# Patient Record
Sex: Female | Born: 1970 | Race: White | Hispanic: No | Marital: Married | Smoking: Former smoker
Health system: Southern US, Community
[De-identification: ages and names within clinical notes are randomized; demographics above are authoritative.]

## PROBLEM LIST (undated history)

## (undated) DIAGNOSIS — D649 Anemia, unspecified: Secondary | ICD-10-CM

## (undated) DIAGNOSIS — K219 Gastro-esophageal reflux disease without esophagitis: Secondary | ICD-10-CM

## (undated) DIAGNOSIS — D72119 Hypereosinophilic syndrome (hes), unspecified: Secondary | ICD-10-CM

## (undated) DIAGNOSIS — I350 Nonrheumatic aortic (valve) stenosis: Secondary | ICD-10-CM

## (undated) DIAGNOSIS — K311 Adult hypertrophic pyloric stenosis: Secondary | ICD-10-CM

## (undated) DIAGNOSIS — T8859XA Other complications of anesthesia, initial encounter: Secondary | ICD-10-CM

## (undated) DIAGNOSIS — J45909 Unspecified asthma, uncomplicated: Secondary | ICD-10-CM

## (undated) DIAGNOSIS — D721 Eosinophilia: Secondary | ICD-10-CM

## (undated) DIAGNOSIS — T4145XA Adverse effect of unspecified anesthetic, initial encounter: Secondary | ICD-10-CM

## (undated) HISTORY — PX: ABDOMINAL HYSTERECTOMY: SHX81

---

## 2004-07-24 ENCOUNTER — Ambulatory Visit: Payer: Self-pay | Admitting: Unknown Physician Specialty

## 2006-08-20 ENCOUNTER — Ambulatory Visit: Payer: Self-pay | Admitting: Obstetrics and Gynecology

## 2007-09-09 ENCOUNTER — Ambulatory Visit: Payer: Self-pay | Admitting: Obstetrics and Gynecology

## 2007-12-11 ENCOUNTER — Ambulatory Visit: Payer: Self-pay | Admitting: Family Medicine

## 2010-09-11 ENCOUNTER — Ambulatory Visit: Payer: Self-pay | Admitting: Family Medicine

## 2010-10-10 ENCOUNTER — Ambulatory Visit: Payer: Self-pay | Admitting: Obstetrics and Gynecology

## 2011-09-12 ENCOUNTER — Ambulatory Visit: Payer: Self-pay | Admitting: Internal Medicine

## 2011-10-14 ENCOUNTER — Ambulatory Visit: Payer: Self-pay | Admitting: Obstetrics and Gynecology

## 2012-10-14 ENCOUNTER — Ambulatory Visit: Payer: Self-pay | Admitting: Obstetrics and Gynecology

## 2013-09-05 ENCOUNTER — Ambulatory Visit: Payer: Self-pay | Admitting: Family Medicine

## 2013-10-27 ENCOUNTER — Ambulatory Visit: Payer: Self-pay

## 2014-05-19 HISTORY — PX: AORTIC VALVE REPLACEMENT: SHX41

## 2016-07-24 ENCOUNTER — Other Ambulatory Visit: Payer: Self-pay | Admitting: Family

## 2016-07-24 DIAGNOSIS — Z1239 Encounter for other screening for malignant neoplasm of breast: Secondary | ICD-10-CM

## 2016-07-28 ENCOUNTER — Inpatient Hospital Stay: Admission: RE | Admit: 2016-07-28 | Payer: Self-pay | Source: Ambulatory Visit

## 2016-08-12 ENCOUNTER — Ambulatory Visit
Admission: RE | Admit: 2016-08-12 | Discharge: 2016-08-12 | Disposition: A | Discharge status: Home / Self Care | Payer: BLUE CROSS/BLUE SHIELD | Source: Ambulatory Visit | Attending: Family | Admitting: Family

## 2016-08-12 ENCOUNTER — Encounter (INDEPENDENT_AMBULATORY_CARE_PROVIDER_SITE_OTHER): Payer: Self-pay

## 2016-08-12 DIAGNOSIS — Z1231 Encounter for screening mammogram for malignant neoplasm of breast: Secondary | ICD-10-CM | POA: Diagnosis present

## 2016-08-12 DIAGNOSIS — Z1239 Encounter for other screening for malignant neoplasm of breast: Secondary | ICD-10-CM

## 2017-12-17 ENCOUNTER — Other Ambulatory Visit: Payer: Self-pay | Admitting: Family

## 2017-12-17 DIAGNOSIS — Z1231 Encounter for screening mammogram for malignant neoplasm of breast: Secondary | ICD-10-CM

## 2018-01-11 ENCOUNTER — Ambulatory Visit
Admission: RE | Admit: 2018-01-11 | Discharge: 2018-01-11 | Disposition: A | Discharge status: Home / Self Care | Payer: BLUE CROSS/BLUE SHIELD | Source: Ambulatory Visit | Attending: Family | Admitting: Family

## 2018-01-11 DIAGNOSIS — Z1231 Encounter for screening mammogram for malignant neoplasm of breast: Secondary | ICD-10-CM | POA: Insufficient documentation

## 2018-03-11 ENCOUNTER — Ambulatory Visit (INDEPENDENT_AMBULATORY_CARE_PROVIDER_SITE_OTHER): Payer: BLUE CROSS/BLUE SHIELD | Admitting: Obstetrics & Gynecology

## 2018-03-11 ENCOUNTER — Encounter: Payer: Self-pay | Admitting: Obstetrics & Gynecology

## 2018-03-11 ENCOUNTER — Other Ambulatory Visit: Payer: Self-pay | Admitting: Internal Medicine

## 2018-03-11 VITALS — BP 120/80 | Ht 64.0 in | Wt 183.0 lb

## 2018-03-11 DIAGNOSIS — R51 Headache: Principal | ICD-10-CM

## 2018-03-11 DIAGNOSIS — N814 Uterovaginal prolapse, unspecified: Secondary | ICD-10-CM | POA: Diagnosis not present

## 2018-03-11 DIAGNOSIS — N812 Incomplete uterovaginal prolapse: Secondary | ICD-10-CM

## 2018-03-11 DIAGNOSIS — R519 Headache, unspecified: Secondary | ICD-10-CM

## 2018-03-11 DIAGNOSIS — R1031 Right lower quadrant pain: Secondary | ICD-10-CM | POA: Diagnosis not present

## 2018-03-11 DIAGNOSIS — N8111 Cystocele, midline: Secondary | ICD-10-CM | POA: Diagnosis not present

## 2018-03-11 DIAGNOSIS — J0130 Acute sphenoidal sinusitis, unspecified: Secondary | ICD-10-CM

## 2018-03-11 NOTE — Progress Notes (Signed)
CC: Vaginal Bulge  Patient is a 47 yo G2P2 WF with 2 prior NSVD 19+ years ago, does not perform chronic lifting with her career, who presents after having hysterectomy for bicornuate uterus and bleeding 10 years ago, now with gradual decline in bladder function (incontinence with cough, sneeze, laughter, exercise) and more recent (one month) of vaginal bulge and pressure and urinary urgency. Patient complains of a cystocele.  Symptoms include: prolapse of tissue with straining, urinary incontinence: mild and discomfort: mild. Symptoms have gradually worsened.    Pt also c/o recent RLQ mild-moderate intermittent sharp shooting pain without radiation or other assoc sx's; no modifiers or other context  Pt takes Coumadin for heart condition  PMHx: She  has no past medical history on file. Also,  has a past surgical history that includes Abdominal hysterectomy., family history includes Transient ischemic attack in her mother; Uterine cancer in her mother.,  reports that she has never smoked. She has never used smokeless tobacco. She reports that she does not drink alcohol or use drugs.  She has a current medication list which includes the following prescription(s): omeprazole, warfarin, cetirizine, and linzess. Also, is allergic to eggs or egg-derived products; ibuprofen; morphine; and budesonide.  Review of Systems  Constitutional: Positive for malaise/fatigue. Negative for chills and fever.  HENT: Positive for congestion. Negative for sinus pain and sore throat.   Eyes: Negative for blurred vision and pain.  Respiratory: Negative for cough and wheezing.   Cardiovascular: Negative for chest pain and leg swelling.  Gastrointestinal: Positive for constipation. Negative for abdominal pain, diarrhea, heartburn, nausea and vomiting.  Genitourinary: Negative for dysuria, frequency, hematuria and urgency.  Musculoskeletal: Positive for joint pain. Negative for back pain, myalgias and neck pain.  Skin:  Negative for itching and rash.  Neurological: Negative for dizziness, tremors and weakness.  Endo/Heme/Allergies: Does not bruise/bleed easily.  Psychiatric/Behavioral: Negative for depression. The patient is not nervous/anxious and does not have insomnia.    Objective: BP 120/80   Ht 5\' 4"  (1.626 m)   Wt 183 lb (83 kg)   BMI 31.41 kg/m  Physical Exam  Constitutional: She is oriented to person, place, and time. She appears well-developed and well-nourished. No distress.  Genitourinary: Rectum normal and vagina normal. Pelvic exam was performed with patient supine. There is no rash or lesion on the right labia. There is no rash or lesion on the left labia. Vagina exhibits no lesion. No bleeding in the vagina. Right adnexum does not display mass and does not display tenderness. Left adnexum does not display mass and does not display tenderness.  Cervix is parous. Cervix does not exhibit motion tenderness, friability or polyp.  Genitourinary Comments: Absent Uterus Cervix prolapses down close to introitus Gr 2 Cystocele Min rectocele  HENT:  Head: Normocephalic and atraumatic. Head is without laceration.  Right Ear: Hearing normal.  Left Ear: Hearing normal.  Nose: No epistaxis.  No foreign bodies.  Mouth/Throat: Uvula is midline, oropharynx is clear and moist and mucous membranes are normal.  Eyes: Pupils are equal, round, and reactive to light.  Neck: Normal range of motion. Neck supple. No thyromegaly present.  Cardiovascular: Normal rate and regular rhythm. Exam reveals no gallop and no friction rub.  No murmur heard. Pulmonary/Chest: Effort normal and breath sounds normal. No respiratory distress. She has no wheezes. Right breast exhibits no mass, no skin change and no tenderness. Left breast exhibits no mass, no skin change and no tenderness.  Abdominal: Soft. Bowel sounds are normal.  She exhibits no distension. There is no tenderness. There is no rebound.  Musculoskeletal: Normal  range of motion.  Neurological: She is alert and oriented to person, place, and time. No cranial nerve deficit.  Skin: Skin is warm and dry.  Psychiatric: She has a normal mood and affect. Judgment normal.  Vitals reviewed.  ASSESSMENT/PLAN:    Problem List Items Addressed This Visit      Genitourinary   Cervical prolapse   Cystocele, midline     Other   RLQ abdominal pain - Primary   Relevant Orders   US PELVIS TRANSVANGINAL NON-OB (TV ONLY)  Options of pessary and surgery discussed w patient related to prolapse and cystocele.    Pros and cons, side effects discussed.    Desires surgery; I would recommend trachelectomy and anterior colporrhaphy    Will also assess for ovarian etiology of RLQ pain, as may be addressed also at time of surgery if cyst or mass found   Counsel w Dr Lilian Kapur regarding Coumadin/Lovenox transition around time of surgery  Annamarie Major, MD, [facility], [facility] 03/11/2018  10:48 AM

## 2018-03-11 NOTE — Patient Instructions (Signed)
Anterior Colporrhaphy Anterior colporrhaphy is surgery to fix a prolapse of organs in the genital tract. Prolapse means the falling down, bulging, dropping, or drooping of an organ. Organs that commonly prolapse include the rectum, bladder, vagina, and uterus. Prolapse can affect a single organ or several organs at the same time. This often worsens when women stop having their monthly periods (menopause) because estrogen loss weakens the muscles and tissues in the genital tract. In addition, prolapse happens when the organs are damaged or weakened. This commonly happens after childbirth and as a result of aging. Surgery is often done for severe prolapses. The type of colporrhaphy done depends on the type of genital prolapse. Types of genital prolapse include the following:  Cystocele. This is a prolapse of the upper (anterior) wall of the vagina. The anterior wall bulges into the vagina and brings the bladder with it.  Rectocele. This is a prolapse of the lower (posterior) wall of the vagina. The posterior vaginal wall bulges into the vagina and brings the rectum with it.  Enterocele. This is a prolapse of part of the pelvic organs called the pouch of Douglas. It also involves a portion of the small bowel. It appears as a bulge under the neck of the uterus at the top of the back wall of the vagina.  Procidentia. This is a complete prolapse of the uterus and the cervix. The prolapse can be seen and felt coming out of the vagina.  LET YOUR HEALTH CARE PROVIDER KNOW ABOUT:  Any allergies you have.  All medicines you are taking, including vitamins, herbs, eye drops, creams, and over-the-counter medicines.  Previous problems you or members of your family have had with the use of anesthetics.  Any blood disorders you have.  Previous surgeries you have had.  Medical conditions you have.  Smoking history or history of alcohol use.  Possibility of pregnancy, if this applies. RISKS AND  COMPLICATIONS Generally, anterior or posterior colporrhaphy is a safe procedure. However, as with any procedure, complications can occur. Possible complications include:  Infection.  Damage to other organs during surgery.  Bleeding after surgery.  Problems urinating.  Problems from the anesthetic.  BEFORE THE PROCEDURE  Ask your health care provider about changing or stopping your regular medicines.  Do not eat or drink anything for at least 8 hours before the surgery.  If you smoke, do not smoke for at least 2 weeks before the surgery.  Make plans to have someone drive you home after your hospital stay. Also, arrange for someone to help you with activities during recovery. PROCEDURE You may be given medicine to help you relax before the surgery (sedative). During the surgery you will be given medicine to make you sleep through the procedure (general anesthetic) or medicine to numb you from the waist down (spinal anesthetic). This medicine will be given through an intravenous (IV) access tube that is put into one of your veins. The procedure will vary depending on the type of repair:  Anterior repair. A cut (incision) is made in the midline section of the front part of the vaginal wall. A triangular-shaped piece of vaginal tissue is removed, and the stronger, healthier tissue is sewn together in order to support and suspend the bladder.  Posterior repair. An incision is made midline on the back wall of the vagina. A triangular portion of vaginal skin is removed to expose the muscle. Excess tissue is removed, and stronger, healthier muscle and ligament tissue is sewn together to support   the rectum.  Anterior and posterior repair. Both procedures are done during the same surgery.  What to expect after the procedure You will be taken to a recovery area. Your blood pressure, pulse, breathing, and temperature (vital signs) will be monitored. This is done until you are stable. Then you  will be transferred to a hospital room. After surgery, you will have a small rubber tube in place to drain your bladder (urinary catheter). This will be in place for 2 to 7 days or until your bladder is working properly on its own. The IV access tube will be removed in 1 to 3 days. You may have a gauze packing in your vagina to prevent bleeding. This will be removed 2 or 3 days after the surgery. You will likely need to stay in the hospital for 3 to 5 days. This information is not intended to replace advice given to you by your health care provider. Make sure you discuss any questions you have with your health care provider. Document Released: 07/26/2003 Document Revised: 10/11/2015 Document Reviewed: 09/24/2012 Elsevier Interactive Patient Education  2017 [employer]  

## 2018-03-19 ENCOUNTER — Encounter: Payer: Self-pay | Admitting: Obstetrics & Gynecology

## 2018-03-19 ENCOUNTER — Ambulatory Visit: Payer: BLUE CROSS/BLUE SHIELD

## 2018-03-19 ENCOUNTER — Ambulatory Visit (INDEPENDENT_AMBULATORY_CARE_PROVIDER_SITE_OTHER): Payer: BLUE CROSS/BLUE SHIELD | Admitting: Obstetrics & Gynecology

## 2018-03-19 ENCOUNTER — Ambulatory Visit (INDEPENDENT_AMBULATORY_CARE_PROVIDER_SITE_OTHER): Payer: BLUE CROSS/BLUE SHIELD

## 2018-03-19 VITALS — BP 120/80 | Ht 64.0 in | Wt 184.0 lb

## 2018-03-19 DIAGNOSIS — N814 Uterovaginal prolapse, unspecified: Secondary | ICD-10-CM | POA: Diagnosis not present

## 2018-03-19 DIAGNOSIS — R1031 Right lower quadrant pain: Secondary | ICD-10-CM

## 2018-03-19 DIAGNOSIS — N8111 Cystocele, midline: Secondary | ICD-10-CM

## 2018-03-19 DIAGNOSIS — N812 Incomplete uterovaginal prolapse: Secondary | ICD-10-CM

## 2018-03-19 NOTE — H&P (View-Only) (Signed)
PRE-OPERATIVE HISTORY AND PHYSICAL EXAM  HPI:  Wendy Bryant is a 47 y.o. [id number]  is being admitted for surgery related to pelvic relaxation.  Patient has h/o 2 prior NSVD 19+ years ago, does not perform chronic lifting with her career, who presents after having hysterectomy for bicornuate uterus and bleeding 10 years ago, now with gradual decline in bladder function (incontinence with cough, sneeze, laughter, exercise) and more recent (one month) of vaginal bulge and pressure and urinary urgency. Patient complains of a cystocele.  Symptoms include: prolapse of tissue with straining, urinary incontinence: mild and discomfort: mild. Symptoms have gradually worsened.  Ultrasound today revealed no cyst or ovarian concerns.  Also no mass at cervix.  PMHx: History reviewed. No pertinent past medical history. Past Surgical History:  Procedure Laterality Date  . ABDOMINAL HYSTERECTOMY     Family History  Problem Relation Age of Onset  . Transient ischemic attack Mother   . Uterine cancer Mother   . Breast cancer Neg Hx    Social History   Tobacco Use  . Smoking status: Never Smoker  . Smokeless tobacco: Never Used  Substance Use Topics  . Alcohol use: Never    Frequency: Never  . Drug use: Never    Current Outpatient Medications:  .  acetaminophen (TYLENOL) 500 MG tablet, Take 1,000 mg by mouth every 6 (six) hours as needed for moderate pain or headache., Disp: , Rfl:  .  ADVAIR DISKUS 250-50 MCG/DOSE AEPB, Inhale 1 puff into the lungs daily., Disp: , Rfl: 1 .  cetirizine (ZYRTEC) 10 MG tablet, Take 10 mg by mouth daily. , Disp: , Rfl:  .  fluticasone (FLONASE) 50 MCG/ACT nasal spray, Place 1 spray into both nostrils daily., Disp: , Rfl:  .  LINZESS 72 MCG capsule, Take 72 mcg by mouth daily as needed (for constipation). , Disp: , Rfl: 1 .  omeprazole (PRILOSEC) 20 MG capsule, Take 40 mg by mouth 2 (two) times daily before a meal. , Disp: , Rfl:  .  Probiotic Product  (PROBIOTIC PO), Take 1 capsule by mouth daily., Disp: , Rfl:  .  warfarin (COUMADIN) 5 MG tablet, Take 2.5-5 mg by mouth See admin instructions. Take 2.5 mg by mouth daily on Tuesday and Saturday. Take 5 mg by mouth daily on all other days., Disp: , Rfl:  Allergies: Eggs or egg-derived products; Ibuprofen; Morphine; and Budesonide  Review of Systems  Constitutional: Negative for chills, fever and malaise/fatigue.  HENT: Negative for congestion, sinus pain and sore throat.   Eyes: Negative for blurred vision and pain.  Respiratory: Negative for cough and wheezing.   Cardiovascular: Negative for chest pain and leg swelling.  Gastrointestinal: Negative for abdominal pain, constipation, diarrhea, heartburn, nausea and vomiting.  Genitourinary: Negative for dysuria, frequency, hematuria and urgency.  Musculoskeletal: Negative for back pain, joint pain, myalgias and neck pain.  Skin: Negative for itching and rash.  Neurological: Negative for dizziness, tremors and weakness.  Endo/Heme/Allergies: Does not bruise/bleed easily.  Psychiatric/Behavioral: Negative for depression. The patient is not nervous/anxious and does not have insomnia.     Objective: BP 120/80   Ht 5\' 4"  (1.626 m)   Wt 184 lb (83.5 kg)   BMI 31.58 kg/m   Filed Weights   03/19/18 0937  Weight: 184 lb (83.5 kg)   Physical Exam  Constitutional: She is oriented to person, place, and time. She appears well-developed and well-nourished. No distress.  HENT:  Head: Normocephalic and atraumatic. Head  is without laceration.  Right Ear: Hearing normal.  Left Ear: Hearing normal.  Nose: No epistaxis.  No foreign bodies.  Mouth/Throat: Uvula is midline, oropharynx is clear and moist and mucous membranes are normal.  Eyes: Pupils are equal, round, and reactive to light.  Neck: Normal range of motion. Neck supple. No thyromegaly present.  Cardiovascular: Normal rate and regular rhythm. Exam reveals no gallop and no friction rub.    No murmur heard. Pulmonary/Chest: Effort normal and breath sounds normal. No respiratory distress. She has no wheezes. Right breast exhibits no mass, no skin change and no tenderness. Left breast exhibits no mass, no skin change and no tenderness.  Abdominal: Soft. Bowel sounds are normal. She exhibits no distension. There is no tenderness. There is no rebound.  Musculoskeletal: Normal range of motion.  Neurological: She is alert and oriented to person, place, and time. No cranial nerve deficit.  Skin: Skin is warm and dry.  Psychiatric: She has a normal mood and affect. Judgment normal.  Vitals reviewed.  Assessment: 1. Cervical prolapse   2. Cystocele, midline   Plan Trachelectomy and Anterior Colporrhaphy. Pt will convert to Lovenox per hematology protocol prior to surgery.  I have had a careful discussion with this patient about all the options available and the risk/benefits of each. I have fully informed this patient that surgery may subject her to a variety of discomforts and risks: She understands that most patients have surgery with little difficulty, but problems can happen ranging from minor to fatal. These include nausea, vomiting, pain, bleeding, infection, poor healing, hernia, or formation of adhesions. Unexpected reactions may occur from any drug or anesthetic given. Unintended injury may occur to other pelvic or abdominal structures such as Fallopian tubes, ovaries, bladder, ureter (tube from kidney to bladder), or bowel. Nerves going from the pelvis to the legs may be injured. Any such injury may require immediate or later additional surgery to correct the problem. Excessive blood loss requiring transfusion is very unlikely but possible. Dangerous blood clots may form in the legs or lungs. Physical and sexual activity will be restricted in varying degrees for an indeterminate period of time but most often 2-6 weeks.  Finally, she understands that it is impossible to list every  possible undesirable effect and that the condition for which surgery is done is not always cured or significantly improved, and in rare cases may be even worse.Ample time was given to answer all questions.  Annamarie Major, MD, [facility], [facility] 03/19/2018  10:04 AM

## 2018-03-19 NOTE — Progress Notes (Signed)
   PRE-OPERATIVE HISTORY AND PHYSICAL EXAM  HPI:  Wendy Bryant is a 47 y.o. G2P2002  is being admitted for surgery related to pelvic relaxation.  Patient has h/o 2 prior NSVD 19+ years ago, does not perform chronic lifting with her career, who presents after having hysterectomy for bicornuate uterus and bleeding 10 years ago, now with gradual decline in bladder function (incontinence with cough, sneeze, laughter, exercise) and more recent (one month) of vaginal bulge and pressure and urinary urgency. Patient complains of a cystocele.  Symptoms include: prolapse of tissue with straining, urinary incontinence: mild and discomfort: mild. Symptoms have gradually worsened.  Ultrasound today revealed no cyst or ovarian concerns.  Also no mass at cervix.  PMHx: History reviewed. No pertinent past medical history. Past Surgical History:  Procedure Laterality Date  . ABDOMINAL HYSTERECTOMY     Family History  Problem Relation Age of Onset  . Transient ischemic attack Mother   . Uterine cancer Mother   . Breast cancer Neg Hx    Social History   Tobacco Use  . Smoking status: Never Smoker  . Smokeless tobacco: Never Used  Substance Use Topics  . Alcohol use: Never    Frequency: Never  . Drug use: Never    Current Outpatient Medications:  .  acetaminophen (TYLENOL) 500 MG tablet, Take 1,000 mg by mouth every 6 (six) hours as needed for moderate pain or headache., Disp: , Rfl:  .  ADVAIR DISKUS 250-50 MCG/DOSE AEPB, Inhale 1 puff into the lungs daily., Disp: , Rfl: 1 .  cetirizine (ZYRTEC) 10 MG tablet, Take 10 mg by mouth daily. , Disp: , Rfl:  .  fluticasone (FLONASE) 50 MCG/ACT nasal spray, Place 1 spray into both nostrils daily., Disp: , Rfl:  .  LINZESS 72 MCG capsule, Take 72 mcg by mouth daily as needed (for constipation). , Disp: , Rfl: 1 .  omeprazole (PRILOSEC) 20 MG capsule, Take 40 mg by mouth 2 (two) times daily before a meal. , Disp: , Rfl:  .  Probiotic Product  (PROBIOTIC PO), Take 1 capsule by mouth daily., Disp: , Rfl:  .  warfarin (COUMADIN) 5 MG tablet, Take 2.5-5 mg by mouth See admin instructions. Take 2.5 mg by mouth daily on Tuesday and Saturday. Take 5 mg by mouth daily on all other days., Disp: , Rfl:  Allergies: Eggs or egg-derived products; Ibuprofen; Morphine; and Budesonide  Review of Systems  Constitutional: Negative for chills, fever and malaise/fatigue.  HENT: Negative for congestion, sinus pain and sore throat.   Eyes: Negative for blurred vision and pain.  Respiratory: Negative for cough and wheezing.   Cardiovascular: Negative for chest pain and leg swelling.  Gastrointestinal: Negative for abdominal pain, constipation, diarrhea, heartburn, nausea and vomiting.  Genitourinary: Negative for dysuria, frequency, hematuria and urgency.  Musculoskeletal: Negative for back pain, joint pain, myalgias and neck pain.  Skin: Negative for itching and rash.  Neurological: Negative for dizziness, tremors and weakness.  Endo/Heme/Allergies: Does not bruise/bleed easily.  Psychiatric/Behavioral: Negative for depression. The patient is not nervous/anxious and does not have insomnia.     Objective: BP 120/80   Ht 5' 4" (1.626 m)   Wt 184 lb (83.5 kg)   BMI 31.58 kg/m   Filed Weights   03/19/18 0937  Weight: 184 lb (83.5 kg)   Physical Exam  Constitutional: She is oriented to person, place, and time. She appears well-developed and well-nourished. No distress.  HENT:  Head: Normocephalic and atraumatic. Head   is without laceration.  Right Ear: Hearing normal.  Left Ear: Hearing normal.  Nose: No epistaxis.  No foreign bodies.  Mouth/Throat: Uvula is midline, oropharynx is clear and moist and mucous membranes are normal.  Eyes: Pupils are equal, round, and reactive to light.  Neck: Normal range of motion. Neck supple. No thyromegaly present.  Cardiovascular: Normal rate and regular rhythm. Exam reveals no gallop and no friction rub.    No murmur heard. Pulmonary/Chest: Effort normal and breath sounds normal. No respiratory distress. She has no wheezes. Right breast exhibits no mass, no skin change and no tenderness. Left breast exhibits no mass, no skin change and no tenderness.  Abdominal: Soft. Bowel sounds are normal. She exhibits no distension. There is no tenderness. There is no rebound.  Musculoskeletal: Normal range of motion.  Neurological: She is alert and oriented to person, place, and time. No cranial nerve deficit.  Skin: Skin is warm and dry.  Psychiatric: She has a normal mood and affect. Judgment normal.  Vitals reviewed.  Assessment: 1. Cervical prolapse   2. Cystocele, midline   Plan Trachelectomy and Anterior Colporrhaphy. Pt will convert to Lovenox per hematology protocol prior to surgery.  I have had a careful discussion with this patient about all the options available and the risk/benefits of each. I have fully informed this patient that surgery may subject her to a variety of discomforts and risks: She understands that most patients have surgery with little difficulty, but problems can happen ranging from minor to fatal. These include nausea, vomiting, pain, bleeding, infection, poor healing, hernia, or formation of adhesions. Unexpected reactions may occur from any drug or anesthetic given. Unintended injury may occur to other pelvic or abdominal structures such as Fallopian tubes, ovaries, bladder, ureter (tube from kidney to bladder), or bowel. Nerves going from the pelvis to the legs may be injured. Any such injury may require immediate or later additional surgery to correct the problem. Excessive blood loss requiring transfusion is very unlikely but possible. Dangerous blood clots may form in the legs or lungs. Physical and sexual activity will be restricted in varying degrees for an indeterminate period of time but most often 2-6 weeks.  Finally, she understands that it is impossible to list every  possible undesirable effect and that the condition for which surgery is done is not always cured or significantly improved, and in rare cases may be even worse.Ample time was given to answer all questions.  Paul Harris, MD, FACOG [facility], [facility] 03/19/2018  10:04 AM  

## 2018-03-19 NOTE — Patient Instructions (Signed)
Anterior  Colporrhaphy Anterior or posterior colporrhaphy is surgery to fix a prolapse of organs in the genital tract. Prolapse means the falling down, bulging, dropping, or drooping of an organ. Organs that commonly prolapse include the rectum, bladder, vagina, and uterus. Prolapse can affect a single organ or several organs at the same time. This often worsens when women stop having their monthly periods (menopause) because estrogen loss weakens the muscles and tissues in the genital tract. In addition, prolapse happens when the organs are damaged or weakened. This commonly happens after childbirth and as a result of aging. Surgery is often done for severe prolapses. The type of colporrhaphy done depends on the type of genital prolapse. Types of genital prolapse include the following:  Cystocele. This is a prolapse of the upper (anterior) wall of the vagina. The anterior wall bulges into the vagina and brings the bladder with it.  Rectocele. This is a prolapse of the lower (posterior) wall of the vagina. The posterior vaginal wall bulges into the vagina and brings the rectum with it.  Enterocele. This is a prolapse of part of the pelvic organs called the pouch of Douglas. It also involves a portion of the small bowel. It appears as a bulge under the neck of the uterus at the top of the back wall of the vagina.  Procidentia. This is a complete prolapse of the uterus and the cervix. The prolapse can be seen and felt coming out of the vagina.  LET [facility] CARE PROVIDER KNOW ABOUT:  Any allergies you have.  All medicines you are taking, including vitamins, herbs, eye drops, creams, and over-the-counter medicines.  Previous problems you or members of your family have had with the use of anesthetics.  Any blood disorders you have.  Previous surgeries you have had.  Medical conditions you have.  Smoking history or history of alcohol use.  Possibility of pregnancy, if this applies. RISKS  AND COMPLICATIONS Generally, anterior or posterior colporrhaphy is a safe procedure. However, as with any procedure, complications can occur. Possible complications include:  Infection.  Damage to other organs during surgery.  Bleeding after surgery.  Problems urinating.  Problems from the anesthetic.  BEFORE THE PROCEDURE  Ask your health care provider about changing or stopping your regular medicines.  Do not eat or drink anything for at least 8 hours before the surgery.  If you smoke, do not smoke for at least 2 weeks before the surgery.  Make plans to have someone drive you home after your hospital stay. Also, arrange for someone to help you with activities during recovery. PROCEDURE You may be given medicine to help you relax before the surgery (sedative). During the surgery you will be given medicine to make you sleep through the procedure (general anesthetic) or medicine to numb you from the waist down (spinal anesthetic). This medicine will be given through an intravenous (IV) access tube that is put into one of your veins. The procedure will vary depending on the type of repair:  Anterior repair. A cut (incision) is made in the midline section of the front part of the vaginal wall. A triangular-shaped piece of vaginal tissue is removed, and the stronger, healthier tissue is sewn together in order to support and suspend the bladder.  Posterior repair. An incision is made midline on the back wall of the vagina. A triangular portion of vaginal skin is removed to expose the muscle. Excess tissue is removed, and stronger, healthier muscle and ligament tissue is sewn  together to support the rectum.  Anterior and posterior repair. Both procedures are done during the same surgery.  What to expect after the procedure You will be taken to a recovery area. Your blood pressure, pulse, breathing, and temperature (vital signs) will be monitored. This is done until you are stable. Then you  will be transferred to a hospital room. After surgery, you will have a small rubber tube in place to drain your bladder (urinary catheter). This will be in place for 2 to 7 days or until your bladder is working properly on its own. The IV access tube will be removed in 1 to 3 days. You may have a gauze packing in your vagina to prevent bleeding. This will be removed 2 or 3 days after the surgery. You will likely need to stay in the hospital for 3 to 5 days. This information is not intended to replace advice given to you by your health care provider. Make sure you discuss any questions you have with your health care provider. Document Released: 07/26/2003 Document Revised: 10/11/2015 Document Reviewed: 09/24/2012 Elsevier Interactive Patient Education  2017 [employer].

## 2018-03-23 ENCOUNTER — Encounter
Admission: RE | Admit: 2018-03-23 | Discharge: 2018-03-23 | Disposition: A | Discharge status: Home / Self Care | Payer: BLUE CROSS/BLUE SHIELD | Source: Ambulatory Visit | Attending: Obstetrics & Gynecology | Admitting: Obstetrics & Gynecology

## 2018-03-23 ENCOUNTER — Other Ambulatory Visit: Payer: Self-pay

## 2018-03-23 HISTORY — DX: Anemia, unspecified: D64.9

## 2018-03-23 HISTORY — DX: Adverse effect of unspecified anesthetic, initial encounter: T41.45XA

## 2018-03-23 HISTORY — DX: Adult hypertrophic pyloric stenosis: K31.1

## 2018-03-23 HISTORY — DX: Other complications of anesthesia, initial encounter: T88.59XA

## 2018-03-23 HISTORY — DX: Gastro-esophageal reflux disease without esophagitis: K21.9

## 2018-03-23 HISTORY — DX: Unspecified asthma, uncomplicated: J45.909

## 2018-03-23 HISTORY — DX: Nonrheumatic aortic (valve) stenosis: I35.0

## 2018-03-23 NOTE — Patient Instructions (Signed)
Your procedure is scheduled on: 03-30-18 Report to Same Day Surgery 2nd floor medical mall [facility] Entrance-take elevator on left to 2nd floor.  Check in with surgery information desk.) To find out your arrival time please call [phone] between 1PM - 3PM on 04-06-18  Remember: Instructions that are not followed completely may result in serious medical risk, up to and including death, or upon the discretion of your surgeon and anesthesiologist your surgery may need to be rescheduled.    _x___ 1. Do not eat food after midnight the night before your procedure. You may drink clear liquids up to 2 hours before you are scheduled to arrive at the hospital for your procedure.  Do not drink clear liquids within 2 hours of your scheduled arrival to the hospital.  Clear liquids include  --Water or Apple juice without pulp  --Clear carbohydrate beverage such as ClearFast or Gatorade  --Black Coffee or Clear Tea (No milk, no creamers, do not add anything to the coffee or Tea   ____Ensure clear carbohydrate drink on the way to the hospital for bariatric patients  ____Ensure clear carbohydrate drink 3 hours before surgery for Dr Rutherford Nail patients if physician instructed.   No gum chewing or hard candies.     __x__ 2. No Alcohol for 24 hours before or after surgery.   __x__3. No Smoking or e-cigarettes for 24 prior to surgery.  Do not use any chewable tobacco products for at least 6 hour prior to surgery   ____  4. Bring all medications with you on the day of surgery if instructed.    __x__ 5. Notify your doctor if there is any change in your medical condition     (cold, fever, infections).    x___6. On the morning of surgery brush your teeth with toothpaste and water.  You may rinse your mouth with mouth wash if you wish.  Do not swallow any toothpaste or mouthwash.   Do not wear jewelry, make-up, hairpins, clips or nail polish.  Do not wear lotions, powders, or perfumes. You may wear  deodorant.  Do not shave 48 hours prior to surgery. Men may shave face and neck.  Do not bring valuables to the hospital.    [facility] is not responsible for any belongings or valuables.               Contacts, dentures or bridgework may not be worn into surgery.  Leave your suitcase in the car. After surgery it may be brought to your room.  For patients admitted to the hospital, discharge time is determined by your treatment team.  _  Patients discharged the day of surgery will not be allowed to drive home.  You will need someone to drive you home and stay with you the night of your procedure.    Please read over the following fact sheets that you were given:   [facility] Preparing for Surgery  _x___ TAKE THE FOLLOWING MEDICATION THE MORNING OF SURGERY WITH A SMALL SIP OF WATER. These include:  1. ZYRTEC  2. PRILOSEC  3.  4.  5.  6.  ____Fleets enema or Magnesium Citrate as directed.   ____ Use CHG Soap or sage wipes as directed on instruction sheet   ____ Use inhalers on the day of surgery and bring to hospital day of surgery  ____ Stop Metformin and Janumet 2 days prior to surgery.    ____ Take 1/2 of usual insulin dose the night before surgery  and none on the morning surgery.   _x___ Follow recommendations from Cardiologist, Pulmonologist or PCP regarding  stopping Aspirin, Coumadin, Plavix ,Eliquis, Effient, or Pradaxa, and Pletal-PT INSTRUCTED BY OFFICE TO STOP HER COUMADIN ON 03-27-18 AND BEGIN LOVENOX BRIDGE   X____Stop Anti-inflammatories such as Advil, Aleve, Ibuprofen, Motrin, Naproxen, Naprosyn, Goodies powders or aspirin products NOW-OK to take Tylenol    ____ Stop supplements until after surgery.     ____ Bring C-Pap to the hospital.

## 2018-03-23 NOTE — Pre-Procedure Instructions (Signed)
ECG 12 Lead5/23/2019 [facility] Care Component Name Value Ref Range  EKG Systolic BP  mmHg  EKG Diastolic BP  mmHg  EKG Ventricular Rate 75 BPM  EKG Atrial Rate 75 BPM  EKG P-R Interval 118 ms  EKG QRS Duration 88 ms  EKG Q-T Interval 394 ms  EKG QTC Calculation 439 ms  EKG Calculated P Axis 62 degrees  EKG Calculated R Axis 71 degrees  EKG Calculated T Axis 72 degrees  QTC Fredericia 424 ms  Result Narrative  NORMAL SINUS RHYTHM NORMAL ECG WHEN COMPARED WITH ECG OF 30-Nov-2014 13:10, NO SIGNIFICANT CHANGE WAS FOUND Confirmed by Joneen Roach ([id number]) on 10/08/2017 6:42:45 PM  Other Result Information  Interface, Rad Results In - 10/08/2017  6:42 PM EDT NORMAL SINUS RHYTHM NORMAL ECG WHEN COMPARED WITH ECG OF 30-Nov-2014 13:10, NO SIGNIFICANT CHANGE WAS FOUND Confirmed by Joneen Roach ([id number]) on 10/08/2017 6:42:45 PM  Status Results Details

## 2018-03-23 NOTE — Pre-Procedure Instructions (Signed)
EchocardiogramW Colorflow Spectral Doppler8/15/2019 [facility] Care Component Name Value Ref Range  LV Diastolic Diameter PLAX 4.4 cm  LV Systolic Diameter PLAX 2.8 cm  IVS Diastolic Thickness 0.9 cm  LVPW Diastolic Thickness 0.9 cm  LVOT Diameter 1.9 cm  Aortic Root Diameter 2.0 cm  LA Systolic Diameter LX 4.2 cm  LA Area 4C View 14.1 cm2  LA Area 2C View 17.9 cm2  AV Mean Gradient 23 mmHg  AV Velocity Time Integral 58.90 cm  LVOT Peak Velocity 0.0 m/s  LVOT Peak Gradient 9.0 mmHg  LVOT Velocity Time Integral 31.9 cm  Mitral E Point Velocity 0.0 m/s  Mitral A Point Velocity 0.0 m/s  Mitral E to A Ratio 0.9   TR Peak Velocity 2.4 m/s  PV Peak Velocity 1.2 m/s  LV E' Lateral Velocity 0.1 m/s  Pulmonary Artery Systolic Pressure 22.0 mmHg  PV peak gradient 5.00 mmHg  AV Peak Velocity 2.9 m/s  AV Peak Gradient 33   Est PA Systolic Pressure 25   LVOT diameter 1.9 cm  LVOT area 2.83 cm2  TASV 10.3 cm/sec  Doppler Velocity Index 0.41   Effective Valve Orifice - Continuity 1.1 cm 2  VTI Ratio 0.40   Other Result Information  This result has an attachment that is not available.  Result Narrative   S/p Aortic valve replacement (19 mm On-X bioprosthetic valve implanted  November 09, 2014)  Normal left ventricular systolic function, ejection fraction 60 to 65%  Mitral annular calcification  Dilated left atrium - mild  Normal aortic prosthetic valve function  Normal right ventricular systolic function  Tricuspid regurgitation - mild  Status Results Details   Encounter Summary

## 2018-03-29 ENCOUNTER — Encounter: Payer: Self-pay | Admitting: *Deleted

## 2018-03-29 MED ORDER — CEFAZOLIN SODIUM-DEXTROSE 2-4 GM/100ML-% IV SOLN
2.0000 g | Freq: Once | INTRAVENOUS | Status: AC
  Administered 2018-03-30: 2 g via INTRAVENOUS

## 2018-03-29 NOTE — Pre-Procedure Instructions (Signed)
Progress Notes - documented in this encounter  Table of Contents for Progress Notes  Ardeth Sportsman, MD - 10/06/2017 8:20 AM EDT  Edger House, RN - 10/06/2017 8:20 AM EDT    Ardeth Sportsman, MD - 10/06/2017 8:20 AM EDT Formatting of this note might be different from the original.  Assessment/Plan  1. Aortic valve stenosis, etiology of cardiac valve disease unspecified My exam patient's aortic valve appears to be normal functioning. She is overdue for repeat echocardiogram which I have ordered today but I do not anticipate that we will have significant impact on her overall symptomatology. - Echocardiogram W Colorflow Spectral Doppler; Future - ECG 12 Lead  2. SOB (shortness of breath) I do believe the patient has developed some form of sleep apnea. We will pursue sleep study as described below for further evaluation. Additionally echocardiogram for evaluation of pulmonary hypertension and she is at high risk given her current weight, history of aortic valve stenosis and probable obstructive sleep apnea. After echocardiogram and sleep study have been completed we will have her follow-up in clinic for further evaluation assessment. - Echocardiogram W Colorflow Spectral Doppler; Future - Pro-BNP - ECG 12 Lead  3. OSA (obstructive sleep apnea) Patient reports that she has been having fairly frequent snoring with poor restorative sleep. She does not have witnessed apneic episodes or reports her husband sleeps very heavily and may not realize if she was having issues. Will undergo sleep study and then follow-up in clinic. - Polysomnography (Standard); Future - ECG 12 Lead  Subjective:  ZOX:WRUE Olin Hauser, FNP Chief complaint: 47 y.o. female with a history of eosinophilic gastritis, adult onset pyloric stenosis, multiple seasonal allergies, multiple food allergies and aortic stenosis status post aortic valve replacement presents to clinic for follow-up after recent valve  replacement.  History of Present Illness: The patient presents today reporting that she is having problems with her breathing similar to how she felt prior to undergoing valve surgery. She states that she becomes short of breath with minimal amounts of ambulation such as walking more than 25-50 feet. She admits to having some coughing and wheezing. She continues on inhaled steroids. 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 The nurses concern that her blood counts are causing her symptoms. She was unaware that her blood counts have been similar to her current levels of hemoglobin and hematocrit for over a year. She denies blood in her stool, dark tarry stool, nosebleeds or the blood loss. She denies chest pain, PND, orthopnea but affirms some lower extremity edema. She does occasionally take Lasix but is very inconsistent about she is using her Lasix.  Past Medical History Patient Active Problem List  Diagnosis  . Eosinophilic gastritis  . Mild intermittent asthma  . Pyloric stenosis, acquired  . SVT (supraventricular tachycardia) (CMS-HCC)  . Multiple food allergies  . Seasonal allergies  . Eosinophilia  . ANA positive  . Aortic stenosis  . Status post aortic valve replacement  . Subtherapeutic international normalized ratio (INR)  . Iron deficiency anemia  . Hypereosinophilic syndrome, idiopathic   Medications: Current Outpatient Medications  Medication Sig Dispense Refill  . acetaminophen (TYLENOL) 325 MG tablet Take 650 mg by mouth every six (6) hours as needed for pain or fever.  . cetirizine (ZYRTEC) 10 MG tablet Take 10 mg by mouth daily.  . cyclobenzaprine (FLEXERIL) 10 MG tablet TAKE 1 TABLET(10 MG) BY MOUTH THREE TIMES DAILY AS NEEDED FOR MUSCLE SPASMS 60  tablet 0  . fluticasone (FLONASE) 50 mcg/actuation nasal spray 1 spray by Each Nare route daily.  . fluticasone-salmeterol (ADVAIR DISKUS) 250-50 mcg/dose diskus Inhale 1 puff 2 (two) times a day.  180 each 1  . MAG/ALUMINUM/SOD BICARB/ALGINC (GAVISCON ORAL) Take by mouth.  . mepolizumab (NUCALA) 100 mg injection Inject 100 mg under the skin every twenty-eight (28) days.  Marland Kitchen omeprazole (PRILOSEC) 20 MG capsule Take two each morning and two each evening, or as directed. 120 capsule 5  . ranitidine (ZANTAC) 150 MG capsule Take 2 capsules (300 mg total) by mouth every evening. 60 capsule 2  . sodium chloride 0.9 % SolP 250 mL with mepolizumab 100 mg SolR 750 mg, OVERFILL 50 Inj 25 mL Infuse 750 mg into a venous catheter once.  . valACYclovir (VALTREX) 500 MG tablet TAKE 4 TABLETS BY MOUTH ONCE FOR FEVER BLISTER OUTBREAK 20 tablet 0  . warfarin (COUMADIN) 5 MG tablet Take 2.5 mg (half tablet) on Tuesday and Saturday, and 1 tablet (5 mg) all other days. 90 tablet 1   No current facility-administered medications for this visit.   Allergies Allergies  Allergen Reactions  . Ibuprofen  . Entocort Ec [Budesonide] Other (See Comments)  Swelling of hands and face, nausea and abdominal pain  . Egg Rash  . Morphine Rash   Social History:  Social History   Tobacco Use  . Smoking status: Former Smoker  Packs/day: 0.25  Years: 1.00  Pack years: 0.25  Last attempt to quit: 11/16/2008  Years since quitting: 8.8  . Smokeless tobacco: Never Used  Substance Use Topics  . Alcohol use: No  . Drug use: No   Family History: Family History  Problem Relation Age of Onset  . Cancer Mother 39  Endometrial cancer  . Stroke Mother  . Cancer Father 4  CLL  . Leukemia Father  . Allergies Brother  . Heart disease Maternal Aunt 56  . Cancer Paternal Aunt 32  Lung; smoker  . Heart disease Maternal Grandfather 70  . Cancer Paternal Grandmother 81  Cancerr in the liver  . COPD Paternal Grandfather  . Anesthesia problems Neg Hx   ROS- 12 system review is negative other than what is specified in the History of Present Illness.  Objective:  Physical Exam Vitals:  10/06/17 0825  BP: 131/68  BP  Site: L Arm  BP Position: Sitting  BP Cuff Size: Medium  Pulse: 71  Temp: 36.8 C (98.3 F)  TempSrc: Oral  SpO2: 99%  Weight: 81.6 kg (179 lb 14.4 oz)  Height: 162.6 cm (5' 4.02")   General- Normal appearing female in no apparent distress. Neurologic- Alert and oriented X3. Cranial nerve II-XII grossly intact. HEENT- Normocephalic atraumatic head. No scleral icterus. Normal dentition and oral pharynx.Patient with mild erythema on the forehead and bridge of her nose. It does not extend to the zygomatic arches Neck- Supple, no carotid bruis, jugular venous pulsation approximately 10 cm. Lungs- clear to auscultation, no wheezes, rhonchi, or rhales. Heart- regular rhythm and rate with a mechanical S1 and S2 and a 1 out of 6 soft ejection murmur heard only at the right upper sternal border without radiation.. Abdomen- Soft, nontender, no organomegally. Extremities- No clubbing or cyanosis. Trace nonpitting edema to the lower extremities bilaterally. Pulses- 2+ pulses in radial and dorsalis pedis bilaterally. Psych- Normal mood, appropriate.  Laboratory data: Lab Results  Component Value Date  WBC 6.3 09/28/2017  HGB 15.1 09/28/2017  HCT 44.6 09/28/2017  PLT 219 09/28/2017  Lab Results  Component Value Date  NA 139 09/28/2017  K 4.3 09/28/2017  CL 102 09/28/2017  CO2 28.0 09/28/2017  BUN 11 09/28/2017  CREATININE 0.70 09/28/2017  GLU 99 09/28/2017  CALCIUM 9.5 09/28/2017  MG 1.9 11/14/2014  PHOS 2.6 11/12/2014   Lab Results  Component Value Date  BILITOT 0.6 09/28/2017  BILIDIR 0.40 07/18/2015  PROT 7.2 09/28/2017  ALBUMIN 4.3 09/28/2017  ALT 35 09/28/2017  AST 29 09/28/2017  ALKPHOS 58 09/28/2017   Lab Results  Component Value Date  INR 2.7 09/24/2017  APTT 51.5 (H) 11/13/2014   Rheumatoid factor is negative CRP 1.5 LDH 633 Most recent CBC with white count of 12.4 hemoglobin 13 hematocrit of 42 with a count of 208 eosinophil count of 5.2.  I have personally  reviewed the images of the following diagnostic studies.  Electrocardiogram: Normal sinus rhythm, normal EKG.  Echocardiogram: Showed ejection fraction 55-60% with mild mitral regurgitation, moderate aortic regurgitation and moderate to severe aortic stenosis. Dimensionless index was 0.29 for peak velocity and TVI. Peak gradient was 50 mmHg, mean gradient was 32 mmHg aortic valve area based on an LVOT diameter 1.9 cm was 0.8 cm.  Repeat echocardiogram from June 2016 showed normal functioning 19 mm mechanical aortic valve with an EF of 60-65% peak velocity was 2.4 m/s, dilated left atrium and mild tricuspid regurgitation.  Cardiac MRI: 1. Tricuspid Aortic valve with asymmetric minimal thickening of the leaflets. Adherent Left-Right leaflets with minimal excursion during systole, and to lesser extent involving the Left-Noncoronary leaflets. Question adhesions from eosinophilic infiltrates, given patient's history of hyper eosinophilia.  2. Resulting mild aortic valve insufficiency (21% regurgitant fraction), and turbulent forward flow without significant increased velocity (243 cm/s) or maximum pressure gradient (23.5 mmHG).  3. No myocardial inflammation/fibrosis, internal thrombus, or abnormalities to suggest manifestations of eosinophilic myocarditis.  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 Patient has probably mild to moderate aortic regurgitation and that her aortic regurgitation probably is more severe than her aortic stenosis.  Electronically signed by Ardeth Sportsman, MD at 10/08/2017 7:16 PM EDT  Back to top of Progress Notes Edger House, RN - 10/06/2017 8:20 AM EDT Patient here today to f/u for aortic stenosis. She and her daughter both state they have noticed what seems like abnormal weight gain since 2015. Patient states she feels swollen all over, especially LE and face at times. She also has noticed abnormal  DOE at times after simply walking a short distance. She states she exercises by walking the dog and tries to be active, but is bothered by the SOB. She continues to have palpitations at times, but states not very bothered by as used to having for many years. She wants to make sure no concerning changes regarding her heart.  Electronically signed by Edger House, RN at 10/08/2017 7:16 PM EDT  Back to top of Progress Notes Plan of Treatment - documented as of this encounter  Upcoming Encounters Upcoming Encounters  Date Type Specialty Care Team Description  04/08/2018 Anticoag visit Pharmacy    04/19/2018 Office Visit Infusion Therapy Toney Rakes, MD  [address]  [place], [place] [address]  [phone]  [phone] (Fax)    06/11/2018 Office Visit Allergy Gurney Maxin, MD  [address]  [id number]  [place], [place] [address]  [phone]  [phone] (Fax)     Scheduled Orders Scheduled Orders  Name  Type Priority Associated Diagnoses Order Schedule  Polysomnography (Standard) Sleep Center Routine OSA (obstructive sleep apnea)  Expected: 10/06/2017 (Approximate), Expires: 10/07/2018

## 2018-03-29 NOTE — Pre-Procedure Instructions (Signed)
Progress Notes - documented in this encounter  Table of Contents for Progress Notes  Deen, Cody Scott, MD - 10/06/2017 8:20 AM EDT  Parker, Sarah S, RN - 10/06/2017 8:20 AM EDT    Deen, Cody Scott, MD - 10/06/2017 8:20 AM EDT Formatting of this note might be different from the original.  Assessment/Plan  1. Aortic valve stenosis, etiology of cardiac valve disease unspecified My exam patient's aortic valve appears to be normal functioning. She is overdue for repeat echocardiogram which I have ordered today but I do not anticipate that we will have significant impact on her overall symptomatology. - Echocardiogram W Colorflow Spectral Doppler; Future - ECG 12 Lead  2. SOB (shortness of breath) I do believe the patient has developed some form of sleep apnea. We will pursue sleep study as described below for further evaluation. Additionally echocardiogram for evaluation of pulmonary hypertension and she is at high risk given her current weight, history of aortic valve stenosis and probable obstructive sleep apnea. After echocardiogram and sleep study have been completed we will have her follow-up in clinic for further evaluation assessment. - Echocardiogram W Colorflow Spectral Doppler; Future - Pro-BNP - ECG 12 Lead  3. OSA (obstructive sleep apnea) Patient reports that she has been having fairly frequent snoring with poor restorative sleep. She does not have witnessed apneic episodes or reports her husband sleeps very heavily and may not realize if she was having issues. Will undergo sleep study and then follow-up in clinic. - Polysomnography (Standard); Future - ECG 12 Lead  Subjective:  PCP:Keri W MacDonald, FNP Chief complaint: 47 y.o. female with a history of eosinophilic gastritis, adult onset pyloric stenosis, multiple seasonal allergies, multiple food allergies and aortic stenosis status post aortic valve replacement presents to clinic for follow-up after recent valve  replacement.  History of Present Illness: The patient presents today reporting that she is having problems with her breathing similar to how she felt prior to undergoing valve surgery. She states that she becomes short of breath with minimal amounts of ambulation such as walking more than 25-50 feet. She admits to having some coughing and wheezing. She continues on inhaled steroids. 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 The nurses concern that her blood counts are causing her symptoms. She was unaware that her blood counts have been similar to her current levels of hemoglobin and hematocrit for over a year. She denies blood in her stool, dark tarry stool, nosebleeds or the blood loss. She denies chest pain, PND, orthopnea but affirms some lower extremity edema. She does occasionally take Lasix but is very inconsistent about she is using her Lasix.  Past Medical History Patient Active Problem List  Diagnosis  . Eosinophilic gastritis  . Mild intermittent asthma  . Pyloric stenosis, acquired  . SVT (supraventricular tachycardia) (CMS-HCC)  . Multiple food allergies  . Seasonal allergies  . Eosinophilia  . ANA positive  . Aortic stenosis  . Status post aortic valve replacement  . Subtherapeutic international normalized ratio (INR)  . Iron deficiency anemia  . Hypereosinophilic syndrome, idiopathic   Medications: Current Outpatient Medications  Medication Sig Dispense Refill  . acetaminophen (TYLENOL) 325 MG tablet Take 650 mg by mouth every six (6) hours as needed for pain or fever.  . cetirizine (ZYRTEC) 10 MG tablet Take 10 mg by mouth daily.  . cyclobenzaprine (FLEXERIL) 10 MG tablet TAKE 1 TABLET(10 MG) BY MOUTH THREE TIMES DAILY AS NEEDED FOR MUSCLE SPASMS 60   tablet 0  . fluticasone (FLONASE) 50 mcg/actuation nasal spray 1 spray by Each Nare route daily.  . fluticasone-salmeterol (ADVAIR DISKUS) 250-50 mcg/dose diskus Inhale 1 puff 2 (two) times a day.  180 each 1  . MAG/ALUMINUM/SOD BICARB/ALGINC (GAVISCON ORAL) Take by mouth.  . mepolizumab (NUCALA) 100 mg injection Inject 100 mg under the skin every twenty-eight (28) days.  . omeprazole (PRILOSEC) 20 MG capsule Take two each morning and two each evening, or as directed. 120 capsule 5  . ranitidine (ZANTAC) 150 MG capsule Take 2 capsules (300 mg total) by mouth every evening. 60 capsule 2  . sodium chloride 0.9 % SolP 250 mL with mepolizumab 100 mg SolR 750 mg, OVERFILL 50 Inj 25 mL Infuse 750 mg into a venous catheter once.  . valACYclovir (VALTREX) 500 MG tablet TAKE 4 TABLETS BY MOUTH ONCE FOR FEVER BLISTER OUTBREAK 20 tablet 0  . warfarin (COUMADIN) 5 MG tablet Take 2.5 mg (half tablet) on Tuesday and Saturday, and 1 tablet (5 mg) all other days. 90 tablet 1   No current facility-administered medications for this visit.   Allergies Allergies  Allergen Reactions  . Ibuprofen  . Entocort Ec [Budesonide] Other (See Comments)  Swelling of hands and face, nausea and abdominal pain  . Egg Rash  . Morphine Rash   Social History:  Social History   Tobacco Use  . Smoking status: Former Smoker  Packs/day: 0.25  Years: 1.00  Pack years: 0.25  Last attempt to quit: 11/16/2008  Years since quitting: 8.8  . Smokeless tobacco: Never Used  Substance Use Topics  . Alcohol use: No  . Drug use: No   Family History: Family History  Problem Relation Age of Onset  . Cancer Mother 56  Endometrial cancer  . Stroke Mother  . Cancer Father 65  CLL  . Leukemia Father  . Allergies Brother  . Heart disease Maternal Aunt 56  . Cancer Paternal Aunt 70  Lung; smoker  . Heart disease Maternal Grandfather 70  . Cancer Paternal Grandmother 50  Cancerr in the liver  . COPD Paternal Grandfather  . Anesthesia problems Neg Hx   ROS- 12 system review is negative other than what is specified in the History of Present Illness.  Objective:  Physical Exam Vitals:  10/06/17 0825  BP: 131/68  BP  Site: L Arm  BP Position: Sitting  BP Cuff Size: Medium  Pulse: 71  Temp: 36.8 C (98.3 F)  TempSrc: Oral  SpO2: 99%  Weight: 81.6 kg (179 lb 14.4 oz)  Height: 162.6 cm (5' 4.02")   General- Normal appearing female in no apparent distress. Neurologic- Alert and oriented X3. Cranial nerve II-XII grossly intact. HEENT- Normocephalic atraumatic head. No scleral icterus. Normal dentition and oral pharynx.Patient with mild erythema on the forehead and bridge of her nose. It does not extend to the zygomatic arches Neck- Supple, no carotid bruis, jugular venous pulsation approximately 10 cm. Lungs- clear to auscultation, no wheezes, rhonchi, or rhales. Heart- regular rhythm and rate with a mechanical S1 and S2 and a 1 out of 6 soft ejection murmur heard only at the right upper sternal border without radiation.. Abdomen- Soft, nontender, no organomegally. Extremities- No clubbing or cyanosis. Trace nonpitting edema to the lower extremities bilaterally. Pulses- 2+ pulses in radial and dorsalis pedis bilaterally. Psych- Normal mood, appropriate.  Laboratory data: Lab Results  Component Value Date  WBC 6.3 09/28/2017  HGB 15.1 09/28/2017  HCT 44.6 09/28/2017  PLT 219 09/28/2017     Lab Results  Component Value Date  NA 139 09/28/2017  K 4.3 09/28/2017  CL 102 09/28/2017  CO2 28.0 09/28/2017  BUN 11 09/28/2017  CREATININE 0.70 09/28/2017  GLU 99 09/28/2017  CALCIUM 9.5 09/28/2017  MG 1.9 11/14/2014  PHOS 2.6 11/12/2014   Lab Results  Component Value Date  BILITOT 0.6 09/28/2017  BILIDIR 0.40 07/18/2015  PROT 7.2 09/28/2017  ALBUMIN 4.3 09/28/2017  ALT 35 09/28/2017  AST 29 09/28/2017  ALKPHOS 58 09/28/2017   Lab Results  Component Value Date  INR 2.7 09/24/2017  APTT 51.5 (H) 11/13/2014   Rheumatoid factor is negative CRP 1.5 LDH 633 Most recent CBC with white count of 12.4 hemoglobin 13 hematocrit of 42 with a count of 208 eosinophil count of 5.2.  I have personally  reviewed the images of the following diagnostic studies.  Electrocardiogram: Normal sinus rhythm, normal EKG.  Echocardiogram: Showed ejection fraction 55-60% with mild mitral regurgitation, moderate aortic regurgitation and moderate to severe aortic stenosis. Dimensionless index was 0.29 for peak velocity and TVI. Peak gradient was 50 mmHg, mean gradient was 32 mmHg aortic valve area based on an LVOT diameter 1.9 cm was 0.8 cm.  Repeat echocardiogram from June 2016 showed normal functioning 19 mm mechanical aortic valve with an EF of 60-65% peak velocity was 2.4 m/s, dilated left atrium and mild tricuspid regurgitation.  Cardiac MRI: 1. Tricuspid Aortic valve with asymmetric minimal thickening of the leaflets. Adherent Left-Right leaflets with minimal excursion during systole, and to lesser extent involving the Left-Noncoronary leaflets. Question adhesions from eosinophilic infiltrates, given patient's history of hyper eosinophilia.  2. Resulting mild aortic valve insufficiency (21% regurgitant fraction), and turbulent forward flow without significant increased velocity (243 cm/s) or maximum pressure gradient (23.5 mmHG).  3. No myocardial inflammation/fibrosis, internal thrombus, or abnormalities to suggest manifestations of eosinophilic myocarditis.  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 Patient has probably mild to moderate aortic regurgitation and that her aortic regurgitation probably is more severe than her aortic stenosis.  Electronically signed by Cody Scott Deen, MD at 10/08/2017 7:16 PM EDT  Back to top of Progress Notes Parker, Sarah S, RN - 10/06/2017 8:20 AM EDT Patient here today to f/u for aortic stenosis. She and her daughter both state they have noticed what seems like abnormal weight gain since 2015. Patient states she feels swollen all over, especially LE and face at times. She also has noticed abnormal  DOE at times after simply walking a short distance. She states she exercises by walking the dog and tries to be active, but is bothered by the SOB. She continues to have palpitations at times, but states not very bothered by as used to having for many years. She wants to make sure no concerning changes regarding her heart.  Electronically signed by Sarah S Parker, RN at 10/08/2017 7:16 PM EDT  Back to top of Progress Notes Plan of Treatment - documented as of this encounter  Upcoming Encounters Upcoming Encounters  Date Type Specialty Care Team Description  04/08/2018 Anticoag visit Pharmacy    04/19/2018 Office Visit Infusion Therapy Vereczkey, Kinga Maria, MD  [address]  [place], [place] [address]  [phone]  [phone] (Fax)    06/11/2018 Office Visit Allergy Iweala, Onyinye Ijeoma, MD  [address]  [id number] [facility]  [place], [place] [address]  [phone]  [phone] (Fax)     Scheduled Orders Scheduled Orders  Name   Type Priority Associated Diagnoses Order Schedule  Polysomnography (Standard) Sleep Center Routine OSA (obstructive sleep apnea)  Expected: 10/06/2017 (Approximate), Expires: 10/07/2018    

## 2018-03-30 ENCOUNTER — Ambulatory Visit: Payer: BLUE CROSS/BLUE SHIELD | Admitting: Anesthesiology

## 2018-03-30 ENCOUNTER — Other Ambulatory Visit: Payer: Self-pay

## 2018-03-30 ENCOUNTER — Observation Stay
Admission: RE | Admit: 2018-03-30 | Discharge: 2018-03-31 | Disposition: A | Discharge status: Home / Self Care | Payer: BLUE CROSS/BLUE SHIELD | Source: Ambulatory Visit | Attending: Obstetrics & Gynecology | Admitting: Obstetrics & Gynecology

## 2018-03-30 ENCOUNTER — Encounter
Admission: RE | Disposition: A | Discharge status: Home / Self Care | Payer: Self-pay | Source: Ambulatory Visit | Attending: Obstetrics & Gynecology

## 2018-03-30 ENCOUNTER — Encounter: Payer: Self-pay | Admitting: Anesthesiology

## 2018-03-30 DIAGNOSIS — N393 Stress incontinence (female) (male): Secondary | ICD-10-CM | POA: Diagnosis present

## 2018-03-30 DIAGNOSIS — D649 Anemia, unspecified: Secondary | ICD-10-CM | POA: Insufficient documentation

## 2018-03-30 DIAGNOSIS — N8111 Cystocele, midline: Secondary | ICD-10-CM | POA: Diagnosis not present

## 2018-03-30 DIAGNOSIS — K219 Gastro-esophageal reflux disease without esophagitis: Secondary | ICD-10-CM | POA: Insufficient documentation

## 2018-03-30 DIAGNOSIS — R32 Unspecified urinary incontinence: Secondary | ICD-10-CM

## 2018-03-30 DIAGNOSIS — Z823 Family history of stroke: Secondary | ICD-10-CM | POA: Diagnosis not present

## 2018-03-30 DIAGNOSIS — Z8049 Family history of malignant neoplasm of other genital organs: Secondary | ICD-10-CM | POA: Insufficient documentation

## 2018-03-30 DIAGNOSIS — Z7901 Long term (current) use of anticoagulants: Secondary | ICD-10-CM | POA: Insufficient documentation

## 2018-03-30 DIAGNOSIS — J45909 Unspecified asthma, uncomplicated: Secondary | ICD-10-CM | POA: Diagnosis not present

## 2018-03-30 DIAGNOSIS — Z90710 Acquired absence of both cervix and uterus: Secondary | ICD-10-CM | POA: Diagnosis not present

## 2018-03-30 DIAGNOSIS — Z7951 Long term (current) use of inhaled steroids: Secondary | ICD-10-CM | POA: Insufficient documentation

## 2018-03-30 DIAGNOSIS — Z79899 Other long term (current) drug therapy: Secondary | ICD-10-CM | POA: Diagnosis not present

## 2018-03-30 DIAGNOSIS — N814 Uterovaginal prolapse, unspecified: Secondary | ICD-10-CM

## 2018-03-30 DIAGNOSIS — N812 Incomplete uterovaginal prolapse: Secondary | ICD-10-CM

## 2018-03-30 DIAGNOSIS — Z87891 Personal history of nicotine dependence: Secondary | ICD-10-CM | POA: Diagnosis not present

## 2018-03-30 DIAGNOSIS — N8110 Cystocele, unspecified: Secondary | ICD-10-CM | POA: Diagnosis not present

## 2018-03-30 HISTORY — PX: TRACHELECTOMY: SHX6586

## 2018-03-30 HISTORY — PX: CYSTOCELE REPAIR: SHX163

## 2018-03-30 HISTORY — DX: Eosinophilia: D72.1

## 2018-03-30 HISTORY — DX: Hypereosinophilic syndrome (hes), unspecified: D72.119

## 2018-03-30 LAB — PROTIME-INR
INR: 1.14
Prothrombin Time: 14.5 seconds (ref 11.4–15.2)

## 2018-03-30 LAB — TYPE AND SCREEN
ABO/RH(D): A POS
Antibody Screen: NEGATIVE

## 2018-03-30 SURGERY — TRACHELECTOMY
Anesthesia: General

## 2018-03-30 MED ORDER — CEFAZOLIN SODIUM-DEXTROSE 2-4 GM/100ML-% IV SOLN
INTRAVENOUS | Status: AC
  Filled 2018-03-30: qty 100

## 2018-03-30 MED ORDER — ONDANSETRON HCL 4 MG/2ML IJ SOLN
4.0000 mg | Freq: Four times a day (QID) | INTRAMUSCULAR | Status: DC | PRN
  Administered 2018-03-31: 4 mg via INTRAVENOUS
  Filled 2018-03-30: qty 2

## 2018-03-30 MED ORDER — OXYCODONE-ACETAMINOPHEN 5-325 MG PO TABS
ORAL_TABLET | ORAL | Status: AC
  Administered 2018-03-30: 1 via ORAL
  Filled 2018-03-30: qty 1

## 2018-03-30 MED ORDER — ACETAMINOPHEN 10 MG/ML IV SOLN
INTRAVENOUS | Status: DC | PRN
  Administered 2018-03-30: 1000 mg via INTRAVENOUS

## 2018-03-30 MED ORDER — LACTATED RINGERS IV SOLN
INTRAVENOUS | Status: DC
  Administered 2018-03-30 – 2018-03-31 (×2): via INTRAVENOUS

## 2018-03-30 MED ORDER — LIDOCAINE-EPINEPHRINE 1 %-1:100000 IJ SOLN
INTRAMUSCULAR | Status: DC | PRN
  Administered 2018-03-30: 15 mL

## 2018-03-30 MED ORDER — FENTANYL CITRATE (PF) 100 MCG/2ML IJ SOLN
INTRAMUSCULAR | Status: DC | PRN
  Administered 2018-03-30: 100 ug via INTRAVENOUS

## 2018-03-30 MED ORDER — ROCURONIUM BROMIDE 50 MG/5ML IV SOLN
INTRAVENOUS | Status: AC
  Filled 2018-03-30: qty 1

## 2018-03-30 MED ORDER — FENTANYL CITRATE (PF) 100 MCG/2ML IJ SOLN
INTRAMUSCULAR | Status: AC
  Administered 2018-03-30: 25 ug via INTRAVENOUS
  Filled 2018-03-30: qty 2

## 2018-03-30 MED ORDER — DOCUSATE SODIUM 100 MG PO CAPS
100.0000 mg | ORAL_CAPSULE | Freq: Two times a day (BID) | ORAL | Status: DC
  Administered 2018-03-30 – 2018-03-31 (×2): 100 mg via ORAL
  Filled 2018-03-30 (×2): qty 1

## 2018-03-30 MED ORDER — LIDOCAINE HCL (CARDIAC) PF 100 MG/5ML IV SOSY
PREFILLED_SYRINGE | INTRAVENOUS | Status: DC | PRN
  Administered 2018-03-30: 100 mg via INTRATRACHEAL

## 2018-03-30 MED ORDER — FENTANYL CITRATE (PF) 100 MCG/2ML IJ SOLN
25.0000 ug | INTRAMUSCULAR | Status: AC | PRN
  Administered 2018-03-30 (×4): 25 ug via INTRAVENOUS
  Filled 2018-03-30: qty 2

## 2018-03-30 MED ORDER — ACETAMINOPHEN 650 MG RE SUPP
650.0000 mg | RECTAL | Status: DC | PRN
  Filled 2018-03-30: qty 1

## 2018-03-30 MED ORDER — OXYCODONE-ACETAMINOPHEN 5-325 MG PO TABS: 1.0000 | ORAL_TABLET | ORAL | 0 refills | Status: DC | PRN

## 2018-03-30 MED ORDER — ONDANSETRON HCL 4 MG/2ML IJ SOLN
INTRAMUSCULAR | Status: AC
  Administered 2018-03-30: 4 mg via INTRAVENOUS
  Filled 2018-03-30: qty 2

## 2018-03-30 MED ORDER — LACTATED RINGERS IV SOLN
INTRAVENOUS | Status: DC
  Administered 2018-03-30: 10:00:00 via INTRAVENOUS

## 2018-03-30 MED ORDER — SULFANILAMIDE 15 % VA CREA
TOPICAL_CREAM | VAGINAL | Status: DC | PRN
  Administered 2018-03-30: 1 via VAGINAL

## 2018-03-30 MED ORDER — LIDOCAINE HCL (PF) 2 % IJ SOLN
INTRAMUSCULAR | Status: AC
  Filled 2018-03-30: qty 10

## 2018-03-30 MED ORDER — OXYBUTYNIN CHLORIDE ER 10 MG PO TB24
10.0000 mg | ORAL_TABLET | Freq: Every day | ORAL | Status: DC
  Administered 2018-03-30: 10 mg via ORAL
  Filled 2018-03-30 (×2): qty 1

## 2018-03-30 MED ORDER — SENNOSIDES-DOCUSATE SODIUM 8.6-50 MG PO TABS: 1.0000 | ORAL_TABLET | Freq: Every evening | ORAL | Status: DC | PRN

## 2018-03-30 MED ORDER — ROCURONIUM BROMIDE 100 MG/10ML IV SOLN
INTRAVENOUS | Status: DC | PRN
  Administered 2018-03-30: 50 mg via INTRAVENOUS

## 2018-03-30 MED ORDER — ACETAMINOPHEN 325 MG PO TABS: 650.0000 mg | ORAL_TABLET | ORAL | Status: DC | PRN

## 2018-03-30 MED ORDER — SUGAMMADEX SODIUM 200 MG/2ML IV SOLN
INTRAVENOUS | Status: DC | PRN
  Administered 2018-03-30: 170 mg via INTRAVENOUS

## 2018-03-30 MED ORDER — ACETAMINOPHEN NICU IV SYRINGE 10 MG/ML
INTRAVENOUS | Status: AC
  Filled 2018-03-30: qty 1

## 2018-03-30 MED ORDER — OXYCODONE-ACETAMINOPHEN 5-325 MG PO TABS
1.0000 | ORAL_TABLET | ORAL | Status: DC | PRN
  Administered 2018-03-30: 1 via ORAL

## 2018-03-30 MED ORDER — BISACODYL 10 MG RE SUPP: 10.0000 mg | Freq: Every day | RECTAL | Status: DC | PRN

## 2018-03-30 MED ORDER — PROMETHAZINE HCL 25 MG/ML IJ SOLN
INTRAMUSCULAR | Status: AC
  Administered 2018-03-30: 6.25 mg via INTRAVENOUS
  Filled 2018-03-30: qty 1

## 2018-03-30 MED ORDER — PROPOFOL 10 MG/ML IV BOLUS
INTRAVENOUS | Status: DC | PRN
  Administered 2018-03-30: 100 mg via INTRAVENOUS

## 2018-03-30 MED ORDER — LIDOCAINE-EPINEPHRINE 1 %-1:100000 IJ SOLN
INTRAMUSCULAR | Status: AC
  Filled 2018-03-30: qty 1

## 2018-03-30 MED ORDER — ONDANSETRON HCL 4 MG/2ML IJ SOLN
INTRAMUSCULAR | Status: AC
  Filled 2018-03-30: qty 2

## 2018-03-30 MED ORDER — HYDROMORPHONE HCL 1 MG/ML IJ SOLN
1.0000 mg | INTRAMUSCULAR | Status: DC | PRN
  Administered 2018-03-30 – 2018-03-31 (×4): 1 mg via INTRAVENOUS
  Filled 2018-03-30 (×4): qty 1

## 2018-03-30 MED ORDER — FENTANYL CITRATE (PF) 100 MCG/2ML IJ SOLN
INTRAMUSCULAR | Status: AC
  Filled 2018-03-30: qty 2

## 2018-03-30 MED ORDER — FENTANYL CITRATE (PF) 100 MCG/2ML IJ SOLN
25.0000 ug | INTRAMUSCULAR | Status: DC | PRN
  Administered 2018-03-30 (×2): 50 ug via INTRAVENOUS
  Filled 2018-03-30: qty 2

## 2018-03-30 MED ORDER — MIDAZOLAM HCL 5 MG/5ML IJ SOLN
INTRAMUSCULAR | Status: DC | PRN
  Administered 2018-03-30 (×2): 1 mg via INTRAVENOUS

## 2018-03-30 MED ORDER — OXYCODONE-ACETAMINOPHEN 5-325 MG PO TABS
1.0000 | ORAL_TABLET | ORAL | Status: DC | PRN
  Administered 2018-03-30: 1 via ORAL
  Filled 2018-03-30: qty 1

## 2018-03-30 MED ORDER — ONDANSETRON HCL 4 MG PO TABS: 4.0000 mg | ORAL_TABLET | Freq: Four times a day (QID) | ORAL | Status: DC | PRN

## 2018-03-30 MED ORDER — MIDAZOLAM HCL 2 MG/2ML IJ SOLN
INTRAMUSCULAR | Status: AC
  Filled 2018-03-30: qty 2

## 2018-03-30 MED ORDER — SUGAMMADEX SODIUM 200 MG/2ML IV SOLN
INTRAVENOUS | Status: AC
  Filled 2018-03-30: qty 2

## 2018-03-30 MED ORDER — ONDANSETRON HCL 4 MG/2ML IJ SOLN
INTRAMUSCULAR | Status: DC | PRN
  Administered 2018-03-30: 4 mg via INTRAVENOUS

## 2018-03-30 MED ORDER — ESTROGENS, CONJUGATED 0.625 MG/GM VA CREA
TOPICAL_CREAM | VAGINAL | Status: AC
  Filled 2018-03-30: qty 30

## 2018-03-30 MED ORDER — PANTOPRAZOLE SODIUM 40 MG PO TBEC
40.0000 mg | DELAYED_RELEASE_TABLET | Freq: Every day | ORAL | Status: DC
  Administered 2018-03-30 – 2018-03-31 (×2): 40 mg via ORAL
  Filled 2018-03-30 (×2): qty 1

## 2018-03-30 MED ORDER — SODIUM CHLORIDE FLUSH 0.9 % IV SOLN
INTRAVENOUS | Status: AC
  Filled 2018-03-30: qty 10

## 2018-03-30 MED ORDER — FENTANYL CITRATE (PF) 100 MCG/2ML IJ SOLN
25.0000 ug | INTRAMUSCULAR | Status: DC | PRN
  Administered 2018-03-30 (×4): 25 ug via INTRAVENOUS

## 2018-03-30 MED ORDER — SIMETHICONE 80 MG PO CHEW: 80.0000 mg | CHEWABLE_TABLET | Freq: Four times a day (QID) | ORAL | Status: DC | PRN

## 2018-03-30 MED ORDER — ENOXAPARIN SODIUM 100 MG/ML ~~LOC~~ SOLN
80.0000 mg | Freq: Two times a day (BID) | SUBCUTANEOUS | Status: DC
  Administered 2018-03-31: 80 mg via SUBCUTANEOUS
  Filled 2018-03-30 (×2): qty 1

## 2018-03-30 MED ORDER — PROMETHAZINE HCL 25 MG/ML IJ SOLN
6.2500 mg | INTRAMUSCULAR | Status: AC | PRN
  Administered 2018-03-30 (×2): 6.25 mg via INTRAVENOUS

## 2018-03-30 MED ORDER — ONDANSETRON HCL 4 MG/2ML IJ SOLN
4.0000 mg | Freq: Once | INTRAMUSCULAR | Status: AC | PRN
  Administered 2018-03-30: 4 mg via INTRAVENOUS

## 2018-03-30 SURGICAL SUPPLY — 57 items
BAG URINE DRAINAGE (UROLOGICAL SUPPLIES) ×3 IMPLANT
BLADE SURG 15 STRL LF DISP TIS (BLADE) ×1 IMPLANT
BLADE SURG 15 STRL SS (BLADE) ×2
BLADE SURG SZ10 CARB STEEL (BLADE) ×3 IMPLANT
BNDG GAUZE 4.5X4.1 6PLY STRL (MISCELLANEOUS) ×3 IMPLANT
CANISTER SUCT 1200ML W/VALVE (MISCELLANEOUS) ×3 IMPLANT
CATH FOLEY 2WAY  5CC 16FR (CATHETERS) ×2
CATH URTH 16FR FL 2W BLN LF (CATHETERS) ×1 IMPLANT
COVER WAND RF STERILE (DRAPES) ×3 IMPLANT
DRAPE PERI LITHO V/GYN (MISCELLANEOUS) ×3 IMPLANT
DRAPE SHEET LG 3/4 BI-LAMINATE (DRAPES) ×3 IMPLANT
DRAPE SURG 17X11 SM STRL (DRAPES) ×3 IMPLANT
DRAPE UNDER BUTTOCK W/FLU (DRAPES) ×3 IMPLANT
ELECT REM PT RETURN 9FT ADLT (ELECTROSURGICAL) ×3
ELECTRODE REM PT RTRN 9FT ADLT (ELECTROSURGICAL) ×1 IMPLANT
GAUZE 4X4 16PLY RFD (DISPOSABLE) IMPLANT
GAUZE PACK 2X3YD (MISCELLANEOUS) ×3 IMPLANT
GLOVE BIO SURGEON STRL SZ7 (GLOVE) ×12 IMPLANT
GLOVE BIO SURGEON STRL SZ8 (GLOVE) ×6 IMPLANT
GLOVE INDICATOR 7.5 STRL GRN (GLOVE) ×12 IMPLANT
GOWN STRL REUS W/ TWL LRG LVL3 (GOWN DISPOSABLE) ×3 IMPLANT
GOWN STRL REUS W/ TWL XL LVL3 (GOWN DISPOSABLE) ×1 IMPLANT
GOWN STRL REUS W/TWL LRG LVL3 (GOWN DISPOSABLE) ×6
GOWN STRL REUS W/TWL XL LVL3 (GOWN DISPOSABLE) ×2
HANDLE YANKAUER SUCT BULB TIP (MISCELLANEOUS) ×3 IMPLANT
KIT TURNOVER CYSTO (KITS) ×3 IMPLANT
KIT TURNOVER KIT A (KITS) ×3 IMPLANT
LABEL OR SOLS (LABEL) ×3 IMPLANT
NDL HPO THNWL 1X22GA REG BVL (NEEDLE) ×1 IMPLANT
NDL SAFETY ECLIPSE 18X1.5 (NEEDLE) ×1 IMPLANT
NEEDLE HYPO 18GX1.5 SHARP (NEEDLE) ×2
NEEDLE HYPO 22GX1.5 SAFETY (NEEDLE) ×3 IMPLANT
NEEDLE MAYO CATGUT SZ 1.5 (NEEDLE) ×3
NEEDLE MAYO CATGUT SZ 2 (NEEDLE) ×1 IMPLANT
NEEDLE SAFETY 22GX1 (NEEDLE) ×2
NEEDLE SPNL 22GX3.5 QUINCKE BK (NEEDLE) ×3 IMPLANT
NS IRRIG 500ML POUR BTL (IV SOLUTION) ×3 IMPLANT
PACK BASIN MINOR ARMC (MISCELLANEOUS) ×3 IMPLANT
PAD OB MATERNITY 4.3X12.25 (PERSONAL CARE ITEMS) ×3 IMPLANT
PAD PREP 24X41 OB/GYN DISP (PERSONAL CARE ITEMS) ×3 IMPLANT
PENCIL ELECTRO HAND CTR (MISCELLANEOUS) ×3 IMPLANT
SOL PREP PVP 2OZ (MISCELLANEOUS) ×3
SOLUTION PREP PVP 2OZ (MISCELLANEOUS) ×1 IMPLANT
SPONGE LAP 18X18 RF (DISPOSABLE) ×3 IMPLANT
SUT ETHIBOND NAB CT1 #1 30IN (SUTURE) ×3 IMPLANT
SUT VIC AB 0 CT1 27 (SUTURE) ×4
SUT VIC AB 0 CT1 27XCR 8 STRN (SUTURE) ×2 IMPLANT
SUT VIC AB 0 CT1 36 (SUTURE) ×3 IMPLANT
SUT VIC AB 1 CT1 36 (SUTURE) ×3 IMPLANT
SUT VIC AB 2-0 CT1 (SUTURE) IMPLANT
SUT VIC AB 2-0 CT1 27 (SUTURE)
SUT VIC AB 2-0 CT1 TAPERPNT 27 (SUTURE) IMPLANT
SUT VIC AB 3-0 SH 27 (SUTURE)
SUT VIC AB 3-0 SH 27X BRD (SUTURE) IMPLANT
SYR 10ML LL (SYRINGE) ×3 IMPLANT
SYR 30ML LL (SYRINGE) ×3 IMPLANT
SYR CONTROL 10ML (SYRINGE) ×3 IMPLANT

## 2018-03-30 NOTE — Op Note (Signed)
  Operative Note   03/30/2018  PRE-OP DIAGNOSIS: Cervical Prolapse, Cystocele, Incontinence  POST-OP DIAGNOSIS: same   PROCEDURE: Procedure(s): TRACHELECTOMY ANTERIOR REPAIR (CYSTOCELE)   SURGEON: Annamarie Major, MD, FACOG  ASST: Dr Bonney Aid, No other capable assistant available, in surgery requiring high level assistant.  ANESTHESIA: General  ESTIMATED BLOOD LOSS: 30 mL  COMPLICATIONS: none  DISPOSITION: PACU - hemodynamically stable.  CONDITION: stable  FINDINGS: Prolapse of cervical stump, and cystocele both Grade 2  PROCEDURE IN DETAIL: The patient was taken to the OR where anesthesia was administed. The patient was positioned in dorsal lithotomy in the [place] stirrups. The patient was then examined under anesthesia with the above noted findings. The patient was prepped and draped in the normal sterile fashion and foley catheter was placed. A Graves speculum was placed in the vagina with normal visualized cervix.   Attention is then turned to the vagina, where speculum is placed along with retractors.  Cervix is grasped with a Jacobs tenaculum.  The circumference is then infiltrated with 1%lidocaine with epinephrine and then dissected first with bovie electrocautery, and then further with mayo scissors.  Uterosacral ligaments are clamped, transected and suture ligated.  Uterine arteries/pedicles are clamped, transected, and suture ligated.  Anterior and posterior cul de sacs are penetrated with dissection, with care to avoid bladder and rectum.  Remaining ligamentous tissue laterally is clamped, transected and suture ligated until cervix is free and removed. Peritoneum is closed with Vicryl suture.  Uterosacral ligaments are plicated with Ethibond suture.     Anterior colporrhaphy is performed.  Allis clamps are placed along the anterior vaginal wall, lidocaine is used to infiltrate the plane, and incision is made midline vertical.  Endopelvic fascia is dissected free of vaginal  mucosa.  Fascia is plicated w interrupted vicryl sutures.  Excess mucosa is excised.  Vaginal incision is closed with a running locking Vicryl suture to include the trachelectomy incision as well.   Excellent hemostasis was noted at the end of the case.     Packing gauze w Premarin cream is placed. A Foley catheter is left in  place inside her bladder. Clear, yellow urine was noted. All instrument needle and lap counts were correct x 2. Patient was awakened taken to recovery room in stable condition.  Annamarie Major, MD, [facility], [facility] 03/30/2018  12:32 PM

## 2018-03-30 NOTE — Transfer of Care (Signed)
Immediate Anesthesia Transfer of Care Note  Patient: Wendy Bryant  Procedure(s) Performed: TRACHELECTOMY (N/A ) ANTERIOR REPAIR (CYSTOCELE) (N/A )  Patient Location: PACU  Anesthesia Type:General  Level of Consciousness: awake  Airway & Oxygen Therapy: Patient Spontanous Breathing and Patient connected to face mask oxygen  Post-op Assessment: Report given to RN and Post -op Vital signs reviewed and stable  Post vital signs: Reviewed and stable  Last Vitals:  Vitals Value Taken Time  BP 122/58 03/30/2018 12:40 PM  Temp 37.1 C 03/30/2018 12:40 PM  Pulse 64 03/30/2018 12:42 PM  Resp 16 03/30/2018 12:42 PM  SpO2 98 % 03/30/2018 12:42 PM  Vitals shown include unvalidated device data.  Last Pain:  Vitals:   03/30/18 0952  TempSrc: Oral         Complications: No apparent anesthesia complications

## 2018-03-30 NOTE — Progress Notes (Addendum)
Bladder scanner revealed 150 ml of urine in the bladder. Patient has not been able to void since coming to post op.

## 2018-03-30 NOTE — Progress Notes (Signed)
Pt. Up to bathroom to void , emptied 40 ml per bladder scan . Dr. Tiburcio Pea paged

## 2018-03-30 NOTE — Progress Notes (Signed)
Day of Surgery Procedure(s) (LRB): TRACHELECTOMY (N/A) ANTERIOR REPAIR (CYSTOCELE) (N/A)  Subjective: Patient reports pain in lower pelvis.  Also unable to void much..    Objective: I have reviewed patient's vital signs, intake and output, medications and labs.  Abd: Min T, ND Incision: none Extr: no calf T, no edema  Bladder scan 340 mL    Void 40 mL.    Unable to void more.    Catheter- 300 mL  Assessment: s/p Procedure(s): TRACHELECTOMY (N/A) ANTERIOR REPAIR (CYSTOCELE) (N/A): Pt has pain and urinary retention.  Plan: Admit overnight observation for pain and urinary retention after pelvic surgery  Leave foley in tonight Voiding trial in am Pain meds Oxybutynin for bladder spasm   LOS: 0 days    Letitia Libra 03/30/2018, 6:52 PM

## 2018-03-30 NOTE — Interval H&P Note (Signed)
History and Physical Interval Note:  03/30/2018 10:17 AM  Wendy Bryant  has presented today for surgery, with the diagnosis of CERVICAL PROLAPSE,CYSTOCELE  The various methods of treatment have been discussed with the patient and family. After consideration of risks, benefits and other options for treatment, the patient has consented to  Procedure(s): TRACHELECTOMY (N/A) ANTERIOR REPAIR (CYSTOCELE) (N/A) as a surgical intervention .  The patient's history has been reviewed, patient examined, no change in status, stable for surgery.  I have reviewed the patient's chart and labs.  Questions were answered to the patient's satisfaction.     Letitia Libra

## 2018-03-30 NOTE — Anesthesia Preprocedure Evaluation (Signed)
Anesthesia Evaluation  Patient identified by MRN, date of birth, ID band Patient awake    Reviewed: Allergy & Precautions, NPO status , Patient's Chart, lab work & pertinent test results, reviewed documented beta blocker date and time   History of Anesthesia Complications (+) history of anesthetic complications  Airway Mallampati: III  TM Distance: >3 FB     Dental  (+) Chipped   Pulmonary asthma , former smoker,           Cardiovascular      Neuro/Psych    GI/Hepatic GERD  ,  Endo/Other    Renal/GU      Musculoskeletal   Abdominal   Peds  Hematology  (+) anemia ,   Anesthesia Other Findings Aortic valve replace.  Reproductive/Obstetrics                             Anesthesia Physical Anesthesia Plan  ASA: III  Anesthesia Plan: General   Post-op Pain Management:    Induction: Intravenous  PONV Risk Score and Plan:   Airway Management Planned: Oral ETT  Additional Equipment:   Intra-op Plan:   Post-operative Plan:   Informed Consent: I have reviewed the patients History and Physical, chart, labs and discussed the procedure including the risks, benefits and alternatives for the proposed anesthesia with the patient or authorized representative who has indicated his/her understanding and acceptance.     Plan Discussed with: CRNA  Anesthesia Plan Comments:         Anesthesia Quick Evaluation

## 2018-03-30 NOTE — Discharge Instructions (Signed)
Cystocele Repair Cystocele repair is surgery to fix a cystocele, which is a bulging, drooping area (hernia) of the bladder that extends into the vagina. This bulging occurs on the top front (anterior) wall of the vagina. The condition may also be called an anterior prolapse or a prolapsed bladder. Tell a health care provider about:  Any allergies you have.  All medicines you are taking, including vitamins, herbs, eye drops, creams, and over-the-counter medicines.  Any problems you or family members have had with anesthetic medicines.  Any blood disorders you have.  Any surgeries you have had.  Any medical conditions you have.  Whether you are pregnant or may be pregnant. What are the risks? Generally, this is a safe procedure. However, problems may occur, including:  Infection.  Too much bleeding.  Allergic reactions to medicines.  Damage to other structures or organs.  Problems with the urinary drainage tube (catheter), such as blockage.  Return of the cystocele.  Problems with the vaginal mesh.  What happens before the procedure? Staying hydrated Follow instructions from your health care provider about hydration, which may include:  Up to 2 hours before the procedure - you may continue to drink clear liquids, such as water, clear fruit juice, black coffee, and plain tea.  Eating and drinking restrictions Follow instructions from your health care provider about eating and drinking, which may include:  8 hours before the procedure - stop eating heavy meals or foods such as meat, fried foods, or fatty foods.  6 hours before the procedure - stop eating light meals or foods, such as toast or cereal.  6 hours before the procedure - stop drinking milk or drinks that contain milk.  2 hours before the procedure - stop drinking clear liquids.  Medicines  Ask your health care provider about: ? Changing or stopping your regular medicines. This is especially important if  you are taking diabetes medicines or blood thinners. ? Taking medicines such as aspirin and ibuprofen. These medicines can thin your blood. Do not take these medicines before your procedure if your health care provider instructs you not to.  You may be given antibiotic medicine to help prevent infection. General instructions  Do not use any products that contain nicotine or tobacco--such as cigarettes and e-cigarettes--for at least 2 weeks before the procedure. If you need help quitting, ask your health care provider.  Do not drink any alcohol for 3 days before the surgery.  Plan to have someone take you home from the hospital or clinic.  Ask your health care provider how your surgical site will be marked or identified. What happens during the procedure?  To reduce your risk of infection: ? Your health care team will wash or sanitize their hands. ? Your skin will be washed with soap.  You will be given one or more of the following: ? A medicine to make you fall asleep (general anesthetic). ? A medicine that is injected into your spine to numb the area below and slightly above the injection site (spinal anesthetic). ? A medicine that is injected into an area of your body to numb everything below the injection site (regional anesthetic).  A thin, flexible tube (indwelling urinary catheter) will be placed in your bladder to drain urine during and after the surgery.  The surgery will be performed through the vagina. An incision will be made in the front wall of the vagina.  The muscle between the bladder and vagina will be pulled up to its normal  position. Stitches (sutures) or a piece of mesh will be used to support the muscle and hold it in place. This will remove the hernia so the top of the bladder does not fall into the opening of the vagina.  The incision on the front wall of the vagina will then be closed with stitches that will dissolve safely into your body and do not need to be  removed. The procedure may vary among health care providers and hospitals. What happens after the procedure?  Your blood pressure, heart rate, breathing rate, and blood oxygen level will be monitored until the medicines you were given have worn off.  You will have a catheter in place to drain your bladder. This will stay in place for 2-7 days or until your bladder is working well on its own.  You may have gauze packing in the vagina. This will be removed 1-2 days after the surgery.  You will be given pain medicine as needed.  You may be given antibiotics to fight infection. This information is not intended to replace advice given to you by your health care provider. Make sure you discuss any questions you have with your health care provider.  AMBULATORY SURGERY  DISCHARGE INSTRUCTIONS   1) The drugs that you were given will stay in your system until tomorrow so for the next 24 hours you should not:  A) Drive an automobile B) Make any legal decisions C) Drink any alcoholic beverage   2) You may resume regular meals tomorrow.  Today it is better to start with liquids and gradually work up to solid foods.  You may eat anything you prefer, but it is better to start with liquids, then soup and crackers, and gradually work up to solid foods.   3) Please notify your doctor immediately if you have any unusual bleeding, trouble breathing, redness and pain at the surgery site, drainage, fever, or pain not relieved by medication.    4) Additional Instructions:        Please contact your physician with any problems or Same Day Surgery at [phone], Monday through Friday 6 am to 4 pm, or [place] [facility] number at [phone].

## 2018-03-30 NOTE — Progress Notes (Signed)
Bladder scan result 321 ,pt. Resting will attempt to urinate soon.

## 2018-03-30 NOTE — Anesthesia Procedure Notes (Signed)
Procedure Name: Intubation Date/Time: 03/30/2018 11:08 AM Performed by: Babs Sciara, CRNA Pre-anesthesia Checklist: Patient identified, Emergency Drugs available, Suction available, Patient being monitored and Timeout performed Patient Re-evaluated:Patient Re-evaluated prior to induction Oxygen Delivery Method: Circle system utilized Preoxygenation: Pre-oxygenation with 100% oxygen Induction Type: IV induction Ventilation: Mask ventilation without difficulty Laryngoscope Size: Mac and 3 Grade View: Grade I Tube type: Oral Tube size: 7.0 mm Number of attempts: 1 Airway Equipment and Method: Stylet Placement Confirmation: ETT inserted through vocal cords under direct vision,  positive ETCO2 and breath sounds checked- equal and bilateral Secured at: 21 (lip) cm Tube secured with: Tape Dental Injury: Teeth and Oropharynx as per pre-operative assessment

## 2018-03-30 NOTE — Progress Notes (Signed)
Foley catheter inserted per Dr. Tiburcio Pea order , Dr. Tiburcio Pea to evaluate and admit.

## 2018-03-30 NOTE — Progress Notes (Signed)
Bladder scanner revealed 41 ml of urine in the bladder.

## 2018-03-30 NOTE — Anesthesia Postprocedure Evaluation (Signed)
Anesthesia Post Note  Patient: Wendy Bryant  Procedure(s) Performed: TRACHELECTOMY (N/A ) ANTERIOR REPAIR (CYSTOCELE) (N/A )  Patient location during evaluation: PACU Anesthesia Type: General Level of consciousness: awake and alert Pain management: pain level controlled Vital Signs Assessment: post-procedure vital signs reviewed and stable Respiratory status: spontaneous breathing, nonlabored ventilation, respiratory function stable and patient connected to nasal cannula oxygen Cardiovascular status: blood pressure returned to baseline and stable Postop Assessment: no apparent nausea or vomiting Anesthetic complications: no     Last Vitals:  Vitals:   03/30/18 1340 03/30/18 1349  BP:  (!) 105/47  Pulse:  (!) 58  Resp:  16  Temp: 36.8 C (!) 36.2 C  SpO2:  100%    Last Pain:  Vitals:   03/30/18 1349  TempSrc: Temporal  PainSc: 8                  Genene Kilman S

## 2018-03-30 NOTE — Anesthesia Post-op Follow-up Note (Signed)
Anesthesia QCDR form completed.        

## 2018-03-31 ENCOUNTER — Encounter: Payer: Self-pay | Admitting: Obstetrics & Gynecology

## 2018-03-31 DIAGNOSIS — N8111 Cystocele, midline: Secondary | ICD-10-CM | POA: Diagnosis not present

## 2018-03-31 LAB — CBC
HCT: 41.6 % (ref 36.0–46.0)
HEMOGLOBIN: 13.2 g/dL (ref 12.0–15.0)
MCH: 29.6 pg (ref 26.0–34.0)
MCHC: 31.7 g/dL (ref 30.0–36.0)
MCV: 93.3 fL (ref 80.0–100.0)
Platelets: 225 10*3/uL (ref 150–400)
RBC: 4.46 MIL/uL (ref 3.87–5.11)
RDW: 13.1 % (ref 11.5–15.5)
WBC: 10.1 10*3/uL (ref 4.0–10.5)
nRBC: 0 % (ref 0.0–0.2)

## 2018-03-31 LAB — CREATININE, SERUM
CREATININE: 0.6 mg/dL (ref 0.44–1.00)
GFR calc non Af Amer: >60 mL/min (ref >60)

## 2018-03-31 MED ORDER — HYDROMORPHONE HCL 2 MG PO TABS
2.0000 mg | ORAL_TABLET | ORAL | Status: DC | PRN
  Administered 2018-03-31: 2 mg via ORAL
  Filled 2018-03-31: qty 1

## 2018-03-31 MED ORDER — HYDROMORPHONE HCL 2 MG PO TABS: 2.0000 mg | ORAL_TABLET | ORAL | 0 refills | Status: DC | PRN

## 2018-03-31 NOTE — Discharge Summary (Signed)
Gynecology Physician Postoperative Discharge Summary  Patient ID: Wendy Bryant MRN: [id number] DOB/AGE: [birth year] 47 y.o.  Admit Date: 03/30/2018 Discharge Date: 03/31/2018  Preoperative Diagnoses: Prolapse  Procedures: Procedure(s) (LRB): TRACHELECTOMY (N/A) ANTERIOR REPAIR (CYSTOCELE) (N/A)  Significant Labs: CBC Latest Ref Rng & Units 03/31/2018  WBC 4.0 - 10.5 K/uL 10.1  Hemoglobin 12.0 - 15.0 g/dL 81.0  Hematocrit 17.5 - 46.0 % 41.6  Platelets 150 - 400 K/uL 225    Hospital Course:  Wendy Bryant is a 47 y.o. [id number]  admitted for scheduled surgery.  She underwent the procedures as mentioned above, her operation was uncomplicated. For further details about surgery, please refer to the operative report. Patient had an uncomplicated postoperative course. By time of discharge on POD#1, her pain was controlled on oral pain medications; she was ambulating, voiding without difficulty, tolerating regular diet and passing flatus. She was deemed stable for discharge to home.   Discharge Exam: Blood pressure (!) 117/58, pulse 69, temperature 98.7 F (37.1 C), temperature source Oral, resp. rate 18, height 5\' 4"  (1.626 m), weight 83.4 kg, SpO2 98 %. General appearance: alert and no distress  Resp: clear to auscultation bilaterally  Cardio: regular rate and rhythm  GI: soft, non-tender; bowel sounds normal; no masses, no organomegaly.  Incision: C/D/I, no erythema, no drainage noted Pelvic: scant blood on pad  Extremities: extremities normal, atraumatic, no cyanosis or edema and Homans sign is negative, no sign of DVT  Discharged Condition: Stable  Disposition: Discharge disposition: 01-Home or Self Care       Discharge Instructions    Call MD for:  persistant nausea and vomiting   Complete by:  As directed    Call MD for:  persistant nausea and vomiting   Complete by:  As directed    Call MD for:  redness, tenderness, or signs of infection (pain, swelling,  redness, odor or green/yellow discharge around incision site)   Complete by:  As directed    Call MD for:  redness, tenderness, or signs of infection (pain, swelling, redness, odor or green/yellow discharge around incision site)   Complete by:  As directed    Call MD for:  severe uncontrolled pain   Complete by:  As directed    Call MD for:  severe uncontrolled pain   Complete by:  As directed    Call MD for:  temperature >100.4   Complete by:  As directed    Call MD for:  temperature >100.4   Complete by:  As directed    Diet general   Complete by:  As directed    Diet general   Complete by:  As directed    Discharge instructions   Complete by:  As directed    Resume activities according to discharge instruction sheets   Discharge instructions   Complete by:  As directed    Resume activities according to discharge instruction sheets   Discontinue Vaginal Packing   Complete by:  As directed    Remove with foley one hour after surgery   Foley catheter - discontinue   Complete by:  As directed    One hour after surgery. Instill 300 mL saline into bladder then immediately remove catheter. Have patient void soon, measure PVR. If patient spontaneously voids >200 mL, leave catheter out; ready for discharge. If the patient spontaneously voids <221ml perform bladder scan: .if PVR is >366ml, straight cath;  Marland KitchenRe-scan after next void .if PVR is >248ml, replace catheter for home use .if  no void within 4 hours, call MD   See below   Increase activity slowly   Complete by:  As directed    Increase activity slowly   Complete by:  As directed      Allergies as of 03/31/2018      Reactions   Eggs Or Egg-derived Products Hives, Rash, Shortness Of Breath, Swelling   Ibuprofen Hives   Morphine Hives, Itching, Rash, Shortness Of Breath, Swelling   Budesonide Other (See Comments)   Swelling of hands and face, nausea and abdominal pain      Medication List    TAKE these medications    acetaminophen 500 MG tablet Commonly known as:  TYLENOL Take 1,000 mg by mouth every 6 (six) hours as needed for moderate pain or headache.   ADVAIR DISKUS 250-50 MCG/DOSE Aepb Generic drug:  Fluticasone-Salmeterol Inhale 1 puff into the lungs every morning.   cetirizine 10 MG tablet Commonly known as:  ZYRTEC Take 10 mg by mouth every morning.   enoxaparin 100 MG/ML injection Commonly known as:  LOVENOX Inject 100 mg into the skin.   fluticasone 50 MCG/ACT nasal spray Commonly known as:  FLONASE Place 1 spray into both nostrils daily.   HYDROmorphone 2 MG tablet Commonly known as:  DILAUDID Take 1 tablet (2 mg total) by mouth every 4 (four) hours as needed for severe pain.   LINZESS 72 MCG capsule Generic drug:  linaclotide Take 72 mcg by mouth daily as needed (for constipation).   omeprazole 20 MG capsule Commonly known as:  PRILOSEC Take 40 mg by mouth 2 (two) times daily before a meal.   PROBIOTIC PO Take 1 capsule by mouth daily.   warfarin 5 MG tablet Commonly known as:  COUMADIN Take 2.5-5 mg by mouth See admin instructions. Take 2.5 mg by mouth daily on Tuesday and Saturday. Take 5 mg by mouth daily on all other days.      Follow-up Information    Nadara Mustard, MD. Go in 2 week(s).   Specialty:  Obstetrics and Gynecology Contact information: [address] [place] [address] [phone]           Annamarie Major, MD

## 2018-03-31 NOTE — Progress Notes (Signed)
Pt given all discharge instructions and understands all.  Rx called to Pharmacy by MD. Pt aware of her f/u appt.  Discharged home via wheelchair by auxilary with parents.

## 2018-03-31 NOTE — Progress Notes (Signed)
1 Day Post-Op Procedure(s) (LRB): TRACHELECTOMY (N/A) ANTERIOR REPAIR (CYSTOCELE) (N/A)  Subjective: Patient reports nausea and pain in lower pelvis.  Unable to void since foley removed at 0600..    Objective: I have reviewed patient's vital signs, intake and output, medications and labs.  Abd: Min T, ND Incision: none Extr: no calf T, no edema Bladder scan- min urine seen in bladder at this time (815)  Assessment: s/p Procedure(s): TRACHELECTOMY (N/A) ANTERIOR REPAIR (CYSTOCELE) (N/A): stable  Plan: Advance diet Encourage ambulation Advance to PO medication Voiding trial for a longer period of time     Option for In and Out Self Cath vs Indwelling Cath discussed if urinary retention continues    No obstruction.  A function of nerve function or relaxation and pain. Pain meds w Dilaudid as has itching and side effects to Percocet    LOS: 0 days    Letitia Libra 03/31/2018, 8:28 AM

## 2018-04-01 LAB — SURGICAL PATHOLOGY

## 2018-04-02 ENCOUNTER — Telehealth: Payer: Self-pay

## 2018-04-02 NOTE — Telephone Encounter (Signed)
Pt had surgery Tues and is still having bleeding.  Is this normal?  [phone]

## 2018-04-05 NOTE — Telephone Encounter (Signed)
Left message to return call 

## 2018-04-05 NOTE — Telephone Encounter (Signed)
Pt reports bleeding comes in spurts, it has gotten lighter, but some times its heavy here and there, pt wants to know how long will this bleeding last . Changes pad about 3 times a day. Is this normal to feel like she cant empty her bladder, she also has pressure, is this normal she states she will sit and only a little urine will come out then she will have to wait until more will come. Pt aware you are not in the office today ;

## 2018-04-06 NOTE — Telephone Encounter (Signed)
Advise Patient Per [facility] that he will see her is she would like to be seen. Patient at this time states her bleeding has slowed down and she will monitor it. She will call back if she has anymore concerns

## 2018-04-06 NOTE — Telephone Encounter (Signed)
Left message to advise pt on [facility] advice, she can make a appointment if she feels the need to

## 2018-04-06 NOTE — Telephone Encounter (Signed)
I can see her this afternoon if she would like.  Some bleeding is expected but she may benefit from some reassurance and I can also examine and check on that.

## 2018-04-07 ENCOUNTER — Encounter: Payer: Self-pay | Admitting: Obstetrics and Gynecology

## 2018-04-07 ENCOUNTER — Other Ambulatory Visit
Admission: RE | Admit: 2018-04-07 | Discharge: 2018-04-07 | Disposition: A | Discharge status: Home / Self Care | Payer: BLUE CROSS/BLUE SHIELD | Source: Ambulatory Visit | Attending: Obstetrics and Gynecology | Admitting: Obstetrics and Gynecology

## 2018-04-07 ENCOUNTER — Ambulatory Visit (INDEPENDENT_AMBULATORY_CARE_PROVIDER_SITE_OTHER): Payer: BLUE CROSS/BLUE SHIELD | Admitting: Obstetrics and Gynecology

## 2018-04-07 VITALS — BP 154/76 | HR 91 | Ht 64.0 in | Wt 186.0 lb

## 2018-04-07 DIAGNOSIS — N99820 Postprocedural hemorrhage and hematoma of a genitourinary system organ or structure following a genitourinary system procedure: Secondary | ICD-10-CM | POA: Diagnosis not present

## 2018-04-07 LAB — PROTIME-INR
INR: 1.49
Prothrombin Time: 17.8 seconds — ABNORMAL HIGH (ref 11.4–15.2)

## 2018-04-07 NOTE — Progress Notes (Signed)
Postoperative Follow-up Patient presents post op from a trachelectomy and anterior colporrhaphy  8 days ago for cervical prolapse and cystocele with urinary incontinence.  Subjective: The patient presents today due to increasing vaginal bleeding.  Notably, the patient has a need for therapeutic anticoagulation.  Prior to her surgery she was taking warfarin 2.5 mg on Tuesdays and Thursdays and 5 mg on other days.  Since her surgery she has been bridging back to a therapeutic warfarin dose using Lovenox 80 mg twice daily.  Her surgery was 8 days ago.  Approximately 3 days ago she was doing well with minimal spotting.  Overnight 3 days ago she began having increasing bleeding and noted that when she got up to use the bathroom in the middle of the night she had much heavier amount of blood on her pad.  This is continued to get worse over the last couple of days.  She notes that she gets up 2-3 times at night and there is a large amount of blood at that time.  Throughout the day she notes the amount of blood becomes less.  She denies fevers, chills, nausea, vomiting.  She is able to void and is having normal bowel movements.  She notes some pelvic pressure but no significant pain.  She is supposed to have her INR level checked tomorrow morning through her usual [facility] clinic. In review of the surgical notes and hospital course notes it appears she had an uncomplicated hospital course and early postoperative course.   Objective: Vitals:   04/07/18 1102  BP: (!) 154/76  Pulse: 91   Vital Signs: BP (!) 154/76 (BP Location: Left Arm, Patient Position: Sitting, Cuff Size: Normal)   Pulse 91   Ht 5\' 4"  (1.626 m)   Wt 186 lb (84.4 kg)   BMI 31.93 kg/m  Constitutional: Well nourished, well developed female in no acute distress.  HEENT: normal Skin: Warm and dry.  Extremity: no edema  Abdomen: Soft, non-tender, normal bowel sounds; no bruits, organomegaly or masses.  Pelvic exam: (female chaperone  present) EGBUS: within normal limits Vagina: Several walnut sized clots that are dark red are present.  A speculum is gently placed in the vagina and clot material is cleared away using a large Q-tip.  The apical aspect of her surgical repair appears intact with no missing sutures.  Perhaps a small amount of oozing is noted with no appreciable accumulation over approximately 1 minute of observation.  The speculum is slowly withdrawn to visualize the anterior colporrhaphy line.  All sutures appear intact.  The distalmost (nearest to the urethral meatus) portion of the suture line does appear to be losing more than the rest.  However there is still no large appreciable accumulation of blood.  The speculum is removed and a bimanual was performed with the suture line apparently intact.  There is no tenderness.  There is a mild amount of fullness at the apical aspect of the closure without tenderness.    Assessment: 47 y.o. s/p trachelectomy, anterior colporrhaphy complicated by postoperative vaginal bleeding in the setting of the need for therapeutic dose anticoagulation.  She is currently in the bridging phase from lovenox to coumadin.    Plan: Discussed treatment options.   Option 1) continue to monitor given that her bleeding is likely due to an increased INR that is medically required. Option 2) attempt vaginal packing to provide tampenade to the suture line.  The difficulty with this approach is that there is no vaginal packing  in the clinic and voiding may be difficult for her if the packing is placed correctly.  There also may be a large degree of discomfort using this method.  After discussion of the above, the patient chooses option 1.  Chest to be sure that she is not supratherapeutic with her warfarin, will get a stat INR today.  I will also have her follow-up closely in 2 days for reassessment of symptoms and further consideration of treatment.  Overall, there appears to be no life-threatening  complication.  Rather, this is more likely a result of her medical need for anticoagulation.  Thomasene Mohair, MD 04/07/2018, 11:12 AM

## 2018-04-09 ENCOUNTER — Ambulatory Visit: Payer: BLUE CROSS/BLUE SHIELD | Admitting: Obstetrics & Gynecology

## 2018-04-09 NOTE — Progress Notes (Deleted)
  Postoperative Follow-up Patient presents post op from trachelectomy and anterior repair for pelvic relaxation, 10 days ago.  Subjective: Patient reports she has had some bleeding, see prior note form Wednesday.. Eating a regular diet without difficulty. The patient is not having any pain.  Activity: normal activities of daily living. Patient reports additional symptom's since surgery of None.  Objective: There were no vitals taken for this visit. OBGyn Exam  Assessment: s/p :  Trachelectomy and anterior repair, for cervical prolapse after prior LSH and cusytocele stable  Plan: Patient has done well after surgery with no apparent complications.  I have discussed the post-operative course to date, and the expected progress moving forward.  The patient understands what complications to be concerned about.  I will see the patient in routine follow up, or sooner if needed.    Activity plan: No heavy lifting. Pelvic rest.  Wendy Bryant 04/09/2018, 8:01 AM

## 2018-04-19 ENCOUNTER — Ambulatory Visit: Payer: BLUE CROSS/BLUE SHIELD | Admitting: Obstetrics & Gynecology

## 2018-04-22 ENCOUNTER — Ambulatory Visit (INDEPENDENT_AMBULATORY_CARE_PROVIDER_SITE_OTHER): Payer: BLUE CROSS/BLUE SHIELD | Admitting: Obstetrics & Gynecology

## 2018-04-22 ENCOUNTER — Encounter: Payer: Self-pay | Admitting: Obstetrics & Gynecology

## 2018-04-22 VITALS — BP 120/80 | Ht 64.0 in | Wt 188.0 lb

## 2018-04-22 DIAGNOSIS — N812 Incomplete uterovaginal prolapse: Secondary | ICD-10-CM

## 2018-04-22 DIAGNOSIS — N814 Uterovaginal prolapse, unspecified: Secondary | ICD-10-CM

## 2018-04-22 DIAGNOSIS — N8111 Cystocele, midline: Secondary | ICD-10-CM

## 2018-04-22 NOTE — Progress Notes (Signed)
  Postoperative Follow-up Patient presents post op from trachelectomy and Anterior repair for pelvic relaxation, 3 weeks ago.  Subjective: Patient reports some improvement in her preop symptoms as well as her post op bleeding she had the first week after surgery. Eating a regular diet without difficulty. The patient is not having any pain.  Activity: normal activities of daily living. Patient reports additional symptom's since surgery of no current or recent bleeding  Objective: BP 120/80   Ht 5\' 4"  (1.626 m)   Wt 188 lb (85.3 kg)   BMI 32.27 kg/m  Physical Exam  Constitutional: She is oriented to person, place, and time. She appears well-developed and well-nourished. No distress.  Musculoskeletal: Normal range of motion.  Neurological: She is alert and oriented to person, place, and time.  Skin: Skin is warm and dry.  Psychiatric: She has a normal mood and affect.  Vitals reviewed.  Assessment: s/p :  trachelectomy and AR stable  Plan: Patient has done well after surgery with no apparent complications.  I have discussed the post-operative course to date, and the expected progress moving forward.  The patient understands what complications to be concerned about.  I will see the patient in routine follow up, or sooner if needed.    Activity plan: No heavy lifting. Pelvic rest  Letitia Libra 04/22/2018, 4:09 PM

## 2018-05-26 ENCOUNTER — Encounter: Payer: Self-pay | Admitting: Obstetrics & Gynecology

## 2018-05-26 ENCOUNTER — Ambulatory Visit (INDEPENDENT_AMBULATORY_CARE_PROVIDER_SITE_OTHER): Payer: BLUE CROSS/BLUE SHIELD | Admitting: Obstetrics & Gynecology

## 2018-05-26 VITALS — BP 120/80 | Ht 64.0 in | Wt 190.0 lb

## 2018-05-26 DIAGNOSIS — N814 Uterovaginal prolapse, unspecified: Secondary | ICD-10-CM

## 2018-05-26 DIAGNOSIS — N8111 Cystocele, midline: Secondary | ICD-10-CM

## 2018-05-26 DIAGNOSIS — N812 Incomplete uterovaginal prolapse: Secondary | ICD-10-CM

## 2018-05-26 NOTE — Progress Notes (Signed)
  Postoperative Follow-up Patient presents post op from TRACHELECTOMY, AR for pelvic relaxation and cervical prolapse, 6 weeks ago. Path: DIAGNOSIS:  A. UTERINE CERVIX; EXCISION:  - BENIGN TRANSFORMATION ZONE.  - NO EVIDENCE OF DYSPLASIA OR MALIGNANCY.  Subjective: Patient reports marked improvement in her preop symptoms. Eating a regular diet without difficulty. The patient is not having any pain.  Activity: normal activities of daily living. Patient reports additional symptom's since surgery of None.  Objective: BP 120/80   Ht 5\' 4"  (1.626 m)   Wt 190 lb (86.2 kg)   BMI 32.61 kg/m  Physical Exam Constitutional:      General: She is not in acute distress.    Appearance: She is well-developed.  Genitourinary:     Pelvic exam was performed with patient supine.     Vagina and rectum normal.     No vaginal erythema or bleeding.     No right or left adnexal mass present.     Right adnexa not tender.     Left adnexa not tender.     Genitourinary Comments: Cervix and uterus absent. Vaginal cuff healing well.  Cardiovascular:     Rate and Rhythm: Normal rate.  Pulmonary:     Effort: Pulmonary effort is normal.  Abdominal:     General: There is no distension.     Palpations: Abdomen is soft.     Tenderness: There is no abdominal tenderness.     Comments: Incision healing well.  Musculoskeletal: Normal range of motion.  Neurological:     Mental Status: She is alert and oriented to person, place, and time.     Cranial Nerves: No cranial nerve deficit.  Skin:    General: Skin is warm and dry.     Assessment: s/p :  Trachelectomy and AR stable  Plan: Patient has done well after surgery with no apparent complications.  I have discussed the post-operative course to date, and the expected progress moving forward.  The patient understands what complications to be concerned about.  I will see the patient in routine follow up, or sooner if needed.    Activity plan: No restriction..   Pelvic rest.  Will monitor bladder sx's over time.  OK to resume activity and sex  Letitia Libra 05/26/2018, 4:14 PM

## 2018-06-30 ENCOUNTER — Telehealth: Payer: Self-pay | Admitting: Obstetrics & Gynecology

## 2018-06-30 NOTE — Telephone Encounter (Signed)
I printed letter for pt to pick up, with what [facility] advised

## 2018-06-30 NOTE — Telephone Encounter (Signed)
Patient has been advised

## 2018-06-30 NOTE — Telephone Encounter (Signed)
Patient is calling about a medication the was billed not being covered. Patient needs to know the Medical need was for Sulfanilamide to be given because she tried contacting Billing and they were no help. So now patient is just needing information about this medication so her insurance will cover medication. Can you please help me figure out the next step to help patient. Thank you!

## 2018-06-30 NOTE — Telephone Encounter (Signed)
Patient is needing something on letter head explaining the medical need of the drug. Patient will come in to fill out Medical record release form for note if need. Please advise

## 2018-06-30 NOTE — Telephone Encounter (Signed)
Let her know this is medicine cream used in OR on gauze that was placed and then later removed in recovery room.  It was required to aid in initial healing, bleeding control, and health of tissues after vaginal surgery. Not sure the specifics of how they charge or account for OR meds, but that is the info that may be helpful to you.

## 2018-06-30 NOTE — Telephone Encounter (Signed)
Called and left voicemail for patient to call back to let her know what Dr. Tiburcio Pea said and to find out if she needs a note from him explain information for her to send to [facility]

## 2018-11-25 ENCOUNTER — Other Ambulatory Visit: Payer: Self-pay | Admitting: Family

## 2018-11-25 DIAGNOSIS — Z1231 Encounter for screening mammogram for malignant neoplasm of breast: Secondary | ICD-10-CM

## 2019-01-17 ENCOUNTER — Ambulatory Visit
Admission: RE | Admit: 2019-01-17 | Discharge: 2019-01-17 | Disposition: A | Discharge status: Home / Self Care | Payer: BC Managed Care – PPO | Source: Ambulatory Visit | Attending: Family | Admitting: Family

## 2019-01-17 ENCOUNTER — Other Ambulatory Visit: Payer: Self-pay

## 2019-01-17 DIAGNOSIS — Z1231 Encounter for screening mammogram for malignant neoplasm of breast: Secondary | ICD-10-CM | POA: Insufficient documentation

## 2019-12-16 ENCOUNTER — Other Ambulatory Visit: Payer: Self-pay | Admitting: Physician Assistant

## 2019-12-16 ENCOUNTER — Other Ambulatory Visit: Payer: Self-pay | Admitting: Acute Care

## 2019-12-16 DIAGNOSIS — Z1231 Encounter for screening mammogram for malignant neoplasm of breast: Secondary | ICD-10-CM

## 2019-12-16 DIAGNOSIS — G35 Multiple sclerosis: Secondary | ICD-10-CM

## 2020-01-03 ENCOUNTER — Ambulatory Visit: Payer: BC Managed Care – PPO

## 2020-01-18 ENCOUNTER — Ambulatory Visit
Admission: RE | Admit: 2020-01-18 | Discharge: 2020-01-18 | Disposition: A | Discharge status: Home / Self Care | Payer: BC Managed Care – PPO | Source: Ambulatory Visit | Attending: Physician Assistant | Admitting: Physician Assistant

## 2020-01-18 ENCOUNTER — Other Ambulatory Visit: Payer: Self-pay

## 2020-01-18 DIAGNOSIS — Z1231 Encounter for screening mammogram for malignant neoplasm of breast: Secondary | ICD-10-CM | POA: Diagnosis not present

## 2020-11-02 ENCOUNTER — Ambulatory Visit
Admission: EM | Admit: 2020-11-02 | Discharge: 2020-11-02 | Disposition: A | Discharge status: Home / Self Care | Payer: BC Managed Care – PPO | Attending: Emergency Medicine | Admitting: Emergency Medicine

## 2020-11-02 ENCOUNTER — Other Ambulatory Visit: Payer: Self-pay

## 2020-11-02 ENCOUNTER — Encounter: Payer: Self-pay | Admitting: Emergency Medicine

## 2020-11-02 DIAGNOSIS — R1031 Right lower quadrant pain: Secondary | ICD-10-CM | POA: Diagnosis present

## 2020-11-02 DIAGNOSIS — R1011 Right upper quadrant pain: Secondary | ICD-10-CM | POA: Diagnosis not present

## 2020-11-02 LAB — POCT URINALYSIS DIP (MANUAL ENTRY)
Bilirubin, UA: NEGATIVE
Glucose, UA: NEGATIVE mg/dL
Ketones, POC UA: NEGATIVE mg/dL
Nitrite, UA: NEGATIVE
Protein Ur, POC: NEGATIVE mg/dL
Spec Grav, UA: 1.01 (ref 1.010–1.025)
Urobilinogen, UA: 0.2 E.U./dL
pH, UA: 6 (ref 5.0–8.0)

## 2020-11-02 NOTE — Discharge Instructions (Addendum)
Go to the Emergency Department for evaluation of abdominal pain.

## 2020-11-02 NOTE — ED Triage Notes (Signed)
Patient c/o RT sided ABD pain x 2 day.   Patient endorses nausea.   Patient denies any diarrhea or vomiting.   Patient endorses RT sided flank pain and RT sided upper back pain.   Patient denies SOB or Chest Pain.   Patient has taken Tylenol with no relief of symptoms.

## 2020-11-02 NOTE — ED Provider Notes (Signed)
[name]    CSN: [id number] Arrival date & time: 11/02/20  1458      History   Chief Complaint Chief Complaint  Patient presents with   Abdominal Pain    HPI Wendy Bryant is a 50 y.o. female.  Patient presents with 2-day history of right lower abdominal pain, nausea, right flank pain, right upper back pain.  The pain started in her right lower abdomen and is now radiating to her right lower back, right flank, right upper abdomen.  She denies fever, chills, dysuria, hematuria, vomiting, diarrhea, chest pain, shortness of breath, or other symptoms.  Treatment attempted with Tylenol.  Last bowel movement this morning.  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  The history is provided by the patient and medical records.   Past Medical History:  Diagnosis Date   Anemia    Aortic valve stenosis    Asthma    Complication of anesthesia    HARD TO WAKE UP AFTER VALVE REP   GERD (gastroesophageal reflux disease)    Hypereosinophilic syndrome    Pyloric stenosis in adult     Patient Active Problem List   Diagnosis Date Noted   Stress incontinence due to pelvic organ prolapse 03/30/2018   RLQ abdominal pain 03/11/2018   Cervical prolapse 03/11/2018   Cystocele, midline 03/11/2018    Past Surgical History:  Procedure Laterality Date   ABDOMINAL HYSTERECTOMY     AORTIC VALVE REPLACEMENT  2016   CYSTOCELE REPAIR N/A 03/30/2018   Procedure: ANTERIOR REPAIR (CYSTOCELE);  Surgeon: Nadara Mustard, MD;  Location: [facility];  Service: Gynecology;  Laterality: N/A;   TRACHELECTOMY N/A 03/30/2018   Procedure: TRACHELECTOMY;  Surgeon: Nadara Mustard, MD;  Location: [facility];  Service: Gynecology;  Laterality: N/A;    OB History     Gravida  2   Para  2   Term  2   Preterm      AB      Living   2      SAB      IAB      Ectopic      Multiple      Live Births               Home Medications    Prior to Admission medications   Medication Sig Start Date End Date Taking? Authorizing Provider  acetaminophen (TYLENOL) 500 MG tablet Take 1,000 mg by mouth every 6 (six) hours as needed for moderate pain or headache.   Yes [provider]  ADVAIR DISKUS 250-50 MCG/DOSE AEPB Inhale 1 puff into the lungs every morning.  12/16/17  Yes [provider]  cetirizine (ZYRTEC) 10 MG tablet Take 10 mg by mouth every morning.    Yes [provider]  LINZESS 72 MCG capsule Take 72 mcg by mouth daily as needed (for constipation).  02/17/18  Yes [provider]  omeprazole (PRILOSEC) 20 MG capsule Take 40 mg by mouth 2 (two) times daily before a meal.  11/06/17  Yes [provider]  warfarin (COUMADIN) 5 MG tablet Take 2.5-5 mg by mouth See admin instructions. Take 2.5 mg by mouth daily on Tuesday and Saturday. Take 5 mg by mouth daily on all other days. 02/26/18  Yes [provider]  enoxaparin (LOVENOX) 100 MG/ML injection Inject 100 mg into the skin.  [provider]  fluticasone (FLONASE) 50 MCG/ACT nasal spray Place 1 spray into both nostrils daily.    [provider]  Probiotic Product (PROBIOTIC PO) Take 1 capsule by mouth daily.    [provider]    Family History Family History  Problem Relation Age of Onset   Transient ischemic attack Mother    Uterine cancer Mother    Breast cancer Neg Hx     Social History Social History   Tobacco Use   Smoking status: Former    Packs/day: 0.25    Years: 4.00    Pack years: 1.00    Types: Cigarettes    Quit date: 03/23/2008    Years since quitting: 12.6   Smokeless tobacco: Never  Vaping Use   Vaping Use: Never used  Substance Use Topics   Alcohol use: Never   Drug use: Never     Allergies   Eggs or egg-derived products, Ibuprofen, Morphine,  and Budesonide   Review of Systems Review of Systems  Constitutional:  Negative for chills and fever.  Respiratory:  Negative for cough and shortness of breath.   Cardiovascular:  Negative for chest pain and palpitations.  Gastrointestinal:  Positive for abdominal pain and nausea. Negative for diarrhea and vomiting.  Genitourinary:  Positive for flank pain. Negative for dysuria, hematuria, pelvic pain and vaginal discharge.  Musculoskeletal:  Positive for back pain. Negative for arthralgias and gait problem.  Skin:  Negative for color change and rash.  All other systems reviewed and are negative.   Physical Exam Triage Vital Signs ED Triage Vitals [11/02/20 1501]  Enc Vitals Group     BP      Pulse      Resp      Temp      Temp src      SpO2      Weight      Height      Head Circumference      Peak Flow      Pain Score 9     Pain Loc      Pain Edu?      Excl. in GC?    No data found.  Updated Vital Signs BP (!) 152/76 (BP Location: Left Arm)   Pulse 87   Temp 98.5 F (36.9 C) (Oral)   Resp 18   SpO2 96%   Visual Acuity Right Eye Distance:   Left Eye Distance:   Bilateral Distance:    Right Eye Near:   Left Eye Near:    Bilateral Near:     Physical Exam Vitals and nursing note reviewed.  Constitutional:      General: She is not in acute distress.    Appearance: She is well-developed. She is not ill-appearing.  HENT:     Head: Normocephalic and atraumatic.     Mouth/Throat:     Mouth: Mucous membranes are moist.  Eyes:     Conjunctiva/sclera: Conjunctivae normal.  Cardiovascular:     Rate and Rhythm: Normal rate and regular rhythm.     Heart sounds: Normal heart sounds.  Pulmonary:     Effort: Pulmonary effort is normal. No respiratory distress.     Breath sounds: Normal breath sounds.  Abdominal:     General: Bowel sounds are normal.     Palpations: Abdomen is soft.     Tenderness: There is abdominal tenderness in the right upper quadrant and  right lower quadrant. There is no right CVA tenderness, left CVA  tenderness, guarding or rebound.  Musculoskeletal:     Cervical back: Neck supple.  Skin:    General: Skin is warm and dry.  Neurological:     General: No focal deficit present.     Mental Status: She is alert and oriented to person, place, and time.     Gait: Gait normal.  Psychiatric:        Mood and Affect: Mood normal.        Behavior: Behavior normal.     UC Treatments / Results  Labs (all labs ordered are listed, but only abnormal results are displayed) Labs Reviewed  POCT URINALYSIS DIP (MANUAL ENTRY) - Abnormal; Notable for the following components:      Result Value   Blood, UA trace-intact (*)    Leukocytes, UA Trace (*)    All other components within normal limits  URINE CULTURE    EKG   Radiology No results found.  Procedures Procedures (including critical care time)  Medications Ordered in UC Medications - No data to display  Initial Impression / Assessment and Plan / UC Course  I have reviewed the triage vital signs and the nursing notes.  Pertinent labs & imaging results that were available during my care of the patient were reviewed by me and considered in my medical decision making (see chart for details).  Right lower quadrant abdominal pain, right upper quadrant pain.  Patient is tender to palpation in her right upper and lower quadrants, worse in her lower.  She has had a hysterectomy and appendectomy.  She still has her ovaries.  She is afebrile, no CVAT.  Urine has trace leukocytes but no nitrites.  Discussed limitations of evaluation of abdominal pain in an urgent care setting.  Discussed that we do not have stat lab work or advanced imaging.  Sending her to the ED for evaluation of her abdominal pain.  She feels stable to drive herself.      Final Clinical Impressions(s) / UC Diagnoses   Final diagnoses:  Right lower quadrant abdominal pain  Right upper quadrant abdominal pain      Discharge Instructions      Go to the Emergency Department for evaluation of abdominal pain.         ED Prescriptions   None    PDMP not reviewed this encounter.   Mickie Bail, NP 11/02/20 1530

## 2020-11-04 LAB — URINE CULTURE

## 2020-11-14 ENCOUNTER — Encounter: Payer: Self-pay | Admitting: Advanced Practice Midwife

## 2020-11-14 ENCOUNTER — Other Ambulatory Visit: Payer: Self-pay

## 2020-11-14 ENCOUNTER — Ambulatory Visit (INDEPENDENT_AMBULATORY_CARE_PROVIDER_SITE_OTHER): Payer: BC Managed Care – PPO | Admitting: Advanced Practice Midwife

## 2020-11-14 VITALS — BP 139/77 | Ht 64.0 in | Wt 194.0 lb

## 2020-11-14 DIAGNOSIS — R102 Pelvic and perineal pain: Secondary | ICD-10-CM

## 2020-11-16 NOTE — Progress Notes (Signed)
Patient ID: Wendy Bryant, female   DOB: 10/08/1970, 50 y.o.   MRN: [id number]  Reason for Visit: Pelvic Pain (Only on right side x 2 weeks)    Subjective:  Date of Service: 11/14/2020  HPI:  Wendy Bryant is a 50 y.o. female being seen for right side pelvic pain that is throbbing, cramping, stabbing and constant for the past couple weeks. The pain is worsening. The pain radiates to her back she thinks due to compensating for the pelvic pain. It has been hard to sit and stand. She admits nausea. She denies fever. She does not have an appendix. She denies any recent trauma or muscle injury. She has a history of cystocele and cervical prolapse repair and is uncertain if she currently has a prolapse. She does mention a feeling of vaginal fullness.  Past Medical History:  Diagnosis Date   Anemia    Aortic valve stenosis    Asthma    Complication of anesthesia    HARD TO WAKE UP AFTER VALVE REP   GERD (gastroesophageal reflux disease)    Hypereosinophilic syndrome    Pyloric stenosis in adult    Family History  Problem Relation Age of Onset   Transient ischemic attack Mother    Uterine cancer Mother    Breast cancer Neg Hx    Past Surgical History:  Procedure Laterality Date   ABDOMINAL HYSTERECTOMY     AORTIC VALVE REPLACEMENT  2016   CYSTOCELE REPAIR N/A 03/30/2018   Procedure: ANTERIOR REPAIR (CYSTOCELE);  Surgeon: Nadara Mustard, MD;  Location: [facility];  Service: Gynecology;  Laterality: N/A;   TRACHELECTOMY N/A 03/30/2018   Procedure: TRACHELECTOMY;  Surgeon: Nadara Mustard, MD;  Location: [facility];  Service: Gynecology;  Laterality: N/A;    Short Social History:  Social History   Tobacco Use   Smoking status: Former    Packs/day: 0.25    Years: 4.00    Pack years: 1.00    Types: Cigarettes    Quit date: 03/23/2008    Years since quitting: 12.6   Smokeless tobacco: Never  Substance Use Topics   Alcohol use: Never    Allergies  Allergen Reactions    Eggs Or Egg-Derived Products Hives, Rash, Shortness Of Breath and Swelling   Ibuprofen Hives   Morphine Hives, Itching, Rash, Shortness Of Breath and Swelling   Oxycodone-Acetaminophen Itching   Budesonide Other (See Comments)    Swelling of hands and face, nausea and abdominal pain     Current Outpatient Medications  Medication Sig Dispense Refill   acetaminophen (TYLENOL) 500 MG tablet Take 1,000 mg by mouth every 6 (six) hours as needed for moderate pain or headache.     ADVAIR DISKUS 250-50 MCG/DOSE AEPB Inhale 1 puff into the lungs every morning.   1   diphenhydrAMINE (SOMINEX) 25 MG tablet Take by mouth.     esomeprazole (NEXIUM) 40 MG capsule Take 40 mg by mouth 2 (two) times daily.     LINZESS 145 MCG CAPS capsule Take 145 mcg by mouth daily.     Loratadine (CLARITIN PO) Take by mouth.     montelukast (SINGULAIR) 10 MG tablet Take 10 mg by mouth daily.     NUCALA 100 MG/ML SOSY Inject into the skin.     traZODone (DESYREL) 50 MG tablet Take 50 mg by mouth at bedtime.     valACYclovir (VALTREX) 500 MG tablet Take by mouth.     warfarin (COUMADIN) 5 MG tablet Take  2.5-5 mg by mouth See admin instructions. Take 2.5 mg by mouth daily on Tuesday and Saturday. Take 5 mg by mouth daily on all other days.     No current facility-administered medications for this visit.    Review of Systems  Constitutional:  Positive for malaise/fatigue. Negative for chills and fever.       Positive for weight gain  HENT:  Negative for congestion, ear discharge, ear pain, hearing loss, sinus pain and sore throat.   Eyes:  Negative for blurred vision and double vision.  Respiratory:  Negative for cough, shortness of breath and wheezing.   Cardiovascular:  Positive for leg swelling. Negative for chest pain and palpitations.  Gastrointestinal:  Positive for nausea. Negative for abdominal pain, blood in stool, constipation, diarrhea, heartburn, melena and vomiting.  Genitourinary:  Negative for  dysuria, flank pain, frequency, hematuria and urgency.       Positive for pelvic pain  Musculoskeletal:  Positive for back pain. Negative for joint pain and myalgias.  Skin:  Negative for itching and rash.  Neurological:  Positive for headaches. Negative for dizziness, tingling, tremors, sensory change, speech change, focal weakness, seizures, loss of consciousness and weakness.  Endo/Heme/Allergies:  Negative for environmental allergies. Does not bruise/bleed easily.  Psychiatric/Behavioral:  Negative for depression, hallucinations, memory loss, substance abuse and suicidal ideas. The patient is not nervous/anxious and does not have insomnia.        Objective:  Objective   Vitals:   11/14/20 1633  BP: 139/77  Weight: 194 lb (88 kg)  Height: 5\' 4"  (1.626 m)   Body mass index is 33.3 kg/m. Constitutional: Well nourished, well developed female in no acute distress.  HEENT: normal Skin: Warm and dry.  Cardiovascular: Regular rate and rhythm.   Extremity:  trace edema   Respiratory: Clear to auscultation bilateral. Normal respiratory effort Abdomen: lower right quadrant tender to palpation Back: no CVAT Neuro: DTRs 2+, Cranial nerves grossly intact Psych: Alert and Oriented x3. No memory deficits. Normal mood and affect.  MS: normal gait, normal bilateral lower extremity ROM/strength/stability.  Pelvic exam: (female chaperone present) is not limited by body habitus EGBUS: within normal limits Vagina: bulging of bladder, normal mucosa, decreased muscle strength Cervix: lower in vagina Uterus: midline, non-tender, no masses Adnexa: increased tenderness of right ovary, no masses, left ovary non-tender, no masses  Data: Urinalysis: trace leukocytes, neg nitrites, neg blood  Assessment/Plan:     50 y.o. G2 P2 female with right side pelvic pain, some bulging of pelvic floor organs  Gyn ultrasound asap Return to clinic for visit with Dr 44 following gyn ultrasound   Tiburcio Pea CNM [facility] 11/16/2020, 3:41 PM

## 2020-11-22 ENCOUNTER — Ambulatory Visit: Payer: BLUE CROSS/BLUE SHIELD | Admitting: Obstetrics and Gynecology

## 2020-12-14 ENCOUNTER — Other Ambulatory Visit: Payer: Self-pay | Admitting: Family Medicine

## 2020-12-14 DIAGNOSIS — Z1231 Encounter for screening mammogram for malignant neoplasm of breast: Secondary | ICD-10-CM

## 2021-01-23 ENCOUNTER — Other Ambulatory Visit: Payer: Self-pay

## 2021-01-23 ENCOUNTER — Ambulatory Visit
Admission: RE | Admit: 2021-01-23 | Discharge: 2021-01-23 | Disposition: A | Discharge status: Home / Self Care | Payer: BC Managed Care – PPO | Source: Ambulatory Visit | Attending: Family Medicine | Admitting: Family Medicine

## 2021-01-23 DIAGNOSIS — Z1231 Encounter for screening mammogram for malignant neoplasm of breast: Secondary | ICD-10-CM | POA: Diagnosis present

## 2021-01-29 ENCOUNTER — Other Ambulatory Visit: Payer: Self-pay | Admitting: Family Medicine

## 2021-01-29 DIAGNOSIS — N6310 Unspecified lump in the right breast, unspecified quadrant: Secondary | ICD-10-CM

## 2021-01-29 DIAGNOSIS — R928 Other abnormal and inconclusive findings on diagnostic imaging of breast: Secondary | ICD-10-CM

## 2021-02-01 ENCOUNTER — Ambulatory Visit
Admission: RE | Admit: 2021-02-01 | Discharge: 2021-02-01 | Disposition: A | Discharge status: Home / Self Care | Payer: BC Managed Care – PPO | Source: Ambulatory Visit | Attending: Family Medicine | Admitting: Family Medicine

## 2021-02-01 ENCOUNTER — Other Ambulatory Visit: Payer: Self-pay

## 2021-02-01 DIAGNOSIS — R928 Other abnormal and inconclusive findings on diagnostic imaging of breast: Secondary | ICD-10-CM | POA: Insufficient documentation

## 2021-02-01 DIAGNOSIS — N6310 Unspecified lump in the right breast, unspecified quadrant: Secondary | ICD-10-CM

## 2021-04-08 ENCOUNTER — Other Ambulatory Visit: Payer: Self-pay | Admitting: Family Medicine

## 2021-04-08 ENCOUNTER — Other Ambulatory Visit (HOSPITAL_COMMUNITY): Payer: Self-pay | Admitting: Family Medicine

## 2021-04-08 DIAGNOSIS — R1011 Right upper quadrant pain: Secondary | ICD-10-CM

## 2021-04-08 DIAGNOSIS — R198 Other specified symptoms and signs involving the digestive system and abdomen: Secondary | ICD-10-CM

## 2021-04-09 ENCOUNTER — Ambulatory Visit
Admission: RE | Admit: 2021-04-09 | Discharge: 2021-04-09 | Disposition: A | Discharge status: Home / Self Care | Payer: BC Managed Care – PPO | Source: Ambulatory Visit | Attending: Family Medicine | Admitting: Family Medicine

## 2021-04-09 ENCOUNTER — Other Ambulatory Visit: Payer: Self-pay

## 2021-04-09 DIAGNOSIS — R1011 Right upper quadrant pain: Secondary | ICD-10-CM | POA: Diagnosis present

## 2021-04-09 DIAGNOSIS — R198 Other specified symptoms and signs involving the digestive system and abdomen: Secondary | ICD-10-CM

## 2021-04-10 ENCOUNTER — Other Ambulatory Visit: Payer: Self-pay | Admitting: Family Medicine

## 2021-04-10 DIAGNOSIS — R1011 Right upper quadrant pain: Secondary | ICD-10-CM

## 2021-04-18 ENCOUNTER — Ambulatory Visit: Admission: RE | Admit: 2021-04-18 | Payer: BC Managed Care – PPO | Source: Ambulatory Visit

## 2021-05-08 ENCOUNTER — Other Ambulatory Visit: Payer: Self-pay | Admitting: Family Medicine

## 2021-05-08 DIAGNOSIS — R1011 Right upper quadrant pain: Secondary | ICD-10-CM

## 2021-12-11 ENCOUNTER — Other Ambulatory Visit: Payer: Self-pay | Admitting: Family Medicine

## 2021-12-11 DIAGNOSIS — Z1231 Encounter for screening mammogram for malignant neoplasm of breast: Secondary | ICD-10-CM

## 2022-02-03 ENCOUNTER — Ambulatory Visit
Admission: RE | Admit: 2022-02-03 | Discharge: 2022-02-03 | Disposition: A | Discharge status: Home / Self Care | Payer: BC Managed Care – PPO | Source: Ambulatory Visit | Attending: Family Medicine | Admitting: Family Medicine

## 2022-02-03 DIAGNOSIS — Z1231 Encounter for screening mammogram for malignant neoplasm of breast: Secondary | ICD-10-CM | POA: Insufficient documentation

## 2022-04-18 ENCOUNTER — Other Ambulatory Visit: Payer: Self-pay

## 2022-04-18 ENCOUNTER — Ambulatory Visit
Admission: RE | Admit: 2022-04-18 | Discharge: 2022-04-18 | Disposition: A | Discharge status: Home / Self Care | Payer: BC Managed Care – PPO | Attending: Family Medicine | Admitting: Family Medicine

## 2022-04-18 ENCOUNTER — Ambulatory Visit
Admission: RE | Admit: 2022-04-18 | Discharge: 2022-04-18 | Disposition: A | Discharge status: Home / Self Care | Payer: BC Managed Care – PPO | Source: Ambulatory Visit | Attending: Family Medicine | Admitting: Family Medicine

## 2022-04-18 DIAGNOSIS — M25521 Pain in right elbow: Secondary | ICD-10-CM | POA: Insufficient documentation

## 2022-08-26 ENCOUNTER — Other Ambulatory Visit: Payer: Self-pay | Admitting: Family Medicine

## 2022-08-26 DIAGNOSIS — R10813 Right lower quadrant abdominal tenderness: Secondary | ICD-10-CM

## 2022-08-28 ENCOUNTER — Ambulatory Visit: Admission: RE | Admit: 2022-08-28 | Payer: No Typology Code available for payment source | Source: Ambulatory Visit

## 2022-09-01 ENCOUNTER — Ambulatory Visit: Payer: No Typology Code available for payment source

## 2022-09-23 ENCOUNTER — Ambulatory Visit
Admission: RE | Admit: 2022-09-23 | Discharge: 2022-09-23 | Disposition: A | Discharge status: Home / Self Care | Payer: 59 | Source: Ambulatory Visit | Attending: Family Medicine | Admitting: Family Medicine

## 2022-09-23 DIAGNOSIS — R10813 Right lower quadrant abdominal tenderness: Secondary | ICD-10-CM | POA: Diagnosis not present

## 2022-09-23 MED ORDER — IOHEXOL 300 MG/ML  SOLN
100.0000 mL | Freq: Once | INTRAMUSCULAR | Status: AC | PRN
  Administered 2022-09-23: 100 mL via INTRAVENOUS

## 2022-10-12 ENCOUNTER — Encounter: Payer: Self-pay | Admitting: Emergency Medicine

## 2022-10-12 ENCOUNTER — Ambulatory Visit
Admission: EM | Admit: 2022-10-12 | Discharge: 2022-10-12 | Disposition: A | Discharge status: Home / Self Care | Payer: 59

## 2022-10-12 DIAGNOSIS — L03113 Cellulitis of right upper limb: Secondary | ICD-10-CM

## 2022-10-12 DIAGNOSIS — L259 Unspecified contact dermatitis, unspecified cause: Secondary | ICD-10-CM

## 2022-10-12 MED ORDER — CEPHALEXIN 500 MG PO CAPS: 500.0000 mg | ORAL_CAPSULE | Freq: Four times a day (QID) | ORAL | 0 refills | Status: AC

## 2022-10-12 MED ORDER — METHYLPREDNISOLONE 4 MG PO TBPK: ORAL_TABLET | ORAL | 0 refills | Status: AC

## 2022-10-12 MED ORDER — TRIAMCINOLONE ACETONIDE 0.5 % EX OINT: 1.0000 | TOPICAL_OINTMENT | Freq: Two times a day (BID) | CUTANEOUS | 0 refills | Status: AC

## 2022-10-12 NOTE — ED Triage Notes (Signed)
Patient states she was working out 3 weeks ago and a briar cut her right forearm.  Patient states that she had itching and had scabbed over.  Patient states that on Monday that the area started to get red, swollen and warm to the touch.  Patient states that she just got back from a cruise.  Patient states that she also has some red areas that have spread to other area on her arm.  Patient denies fevers.  Patient reports constant itching.

## 2022-10-12 NOTE — ED Provider Notes (Signed)
[facility]    CSN: [id number] Arrival date & time: 10/12/22  0917      History   Chief Complaint Chief Complaint  Patient presents with   Cellulitis   Rash    HPI Wendy Bryant is a 52 y.o. female presenting for rash of bilateral arms for the past 5 to 6 days.  Patient reports she sustained a scratch from a briar 2-3 weeks ago and the area was scabbed up and healing over.  She reports while on the cruise she developed a rash.  She says she has a rash in the area where the scratch was and on the opposite arm.  Does not know if it is related.  She says the rash is very itchy.  It is not painful.  She reports her son's girlfriend has had similar rash. She has been taking OTC meds without relief.  Also tried mupirocin ointment a dose of Amoxicillin. She denies using any new detergents and says she washed all of her close because she got back home.  She cannot think of anything that could have caused the rash.    HPI  Past Medical History:  Diagnosis Date   Anemia    Aortic valve stenosis    Asthma    Complication of anesthesia    HARD TO WAKE UP AFTER VALVE REP   GERD (gastroesophageal reflux disease)    Hypereosinophilic syndrome    Pyloric stenosis in adult     Patient Active Problem List   Diagnosis Date Noted   Stress incontinence due to pelvic organ prolapse 03/30/2018   RLQ abdominal pain 03/11/2018   Cervical prolapse 03/11/2018   Cystocele, midline 03/11/2018    Past Surgical History:  Procedure Laterality Date   ABDOMINAL HYSTERECTOMY     AORTIC VALVE REPLACEMENT  2016   CYSTOCELE REPAIR N/A 03/30/2018   Procedure: ANTERIOR REPAIR (CYSTOCELE);  Surgeon: Nadara Mustard, MD;  Location: [facility];  Service: Gynecology;  Laterality: N/A;   TRACHELECTOMY N/A 03/30/2018   Procedure: TRACHELECTOMY;  Surgeon: Nadara Mustard, MD;  Location: [facility];  Service: Gynecology;  Laterality: N/A;    OB History     Gravida  2   Para  2   Term  2    Preterm      AB      Living  2      SAB      IAB      Ectopic      Multiple      Live Births               Home Medications    Prior to Admission medications   Medication Sig Start Date End Date Taking? Authorizing Provider  cephALEXin (KEFLEX) 500 MG capsule Take 1 capsule (500 mg total) by mouth 4 (four) times daily for 7 days. 10/12/22 10/19/22 Yes Shirlee Latch, PA-C  methylPREDNISolone (MEDROL DOSEPAK) 4 MG TBPK tablet Take according to dosepack 10/12/22  Yes Eusebio Friendly B, PA-C  triamcinolone ointment (KENALOG) 0.5 % Apply 1 Application topically 2 (two) times daily. 10/12/22  Yes Shirlee Latch, PA-C  acetaminophen (TYLENOL) 500 MG tablet Take 1,000 mg by mouth every 6 (six) hours as needed for moderate pain or headache.    [provider]  ADVAIR DISKUS 250-50 MCG/DOSE AEPB Inhale 1 puff into the lungs every morning.  12/16/17   [provider]  diphenhydrAMINE (SOMINEX) 25 MG tablet Take by mouth.  [provider]  esomeprazole (NEXIUM) 40 MG capsule Take 40 mg by mouth 2 (two) times daily. 08/17/20   [provider]  LINZESS 145 MCG CAPS capsule Take 145 mcg by mouth daily. 11/06/20   [provider]  Loratadine (CLARITIN PO) Take by mouth.    [provider]  montelukast (SINGULAIR) 10 MG tablet Take 10 mg by mouth daily. 11/07/20   [provider]  NUCALA 100 MG/ML SOSY Inject into the skin. 10/30/20   [provider]  traZODone (DESYREL) 50 MG tablet Take 50 mg by mouth at bedtime. 11/07/20   [provider]  valACYclovir (VALTREX) 500 MG tablet Take by mouth.    [provider]  warfarin (COUMADIN) 5 MG tablet Take 2.5-5 mg by mouth See admin instructions. Take 2.5 mg by mouth daily on Tuesday and Saturday. Take 5 mg by mouth daily on all other days. 02/26/18   [provider]  WEGOVY 1 MG/0.5ML SOAJ Inject into the skin.    [provider]    Family  History Family History  Problem Relation Age of Onset   Transient ischemic attack Mother    Uterine cancer Mother    Breast cancer Neg Hx     Social History Social History   Tobacco Use   Smoking status: Former    Packs/day: 0.25    Years: 4.00    Additional pack years: 0.00    Total pack years: 1.00    Types: Cigarettes    Quit date: 03/23/2008    Years since quitting: 14.5   Smokeless tobacco: Never  Vaping Use   Vaping Use: Never used  Substance Use Topics   Alcohol use: Never   Drug use: Never     Allergies   Egg-derived products, Ibuprofen, Morphine, Oxycodone-acetaminophen, and Budesonide   Review of Systems Review of Systems  Constitutional:  Negative for fatigue and fever.  Musculoskeletal:  Negative for arthralgias and joint swelling.  Skin:  Positive for color change and rash.  Neurological:  Negative for weakness.     Physical Exam Triage Vital Signs ED Triage Vitals  Enc Vitals Group     BP      Pulse      Resp      Temp      Temp src      SpO2      Weight      Height      Head Circumference      Peak Flow      Pain Score      Pain Loc      Pain Edu?      Excl. in GC?    No data found.  Updated Vital Signs BP 115/79 (BP Location: Right Arm)   Pulse 75   Temp 98.2 F (36.8 C) (Oral)   Resp 14   Ht 5\' 4"  (1.626 m)   Wt 194 lb 0.1 oz (88 kg)   SpO2 97%   BMI 33.30 kg/m      Physical Exam Vitals and nursing note reviewed.  Constitutional:      General: She is not in acute distress.    Appearance: Normal appearance. She is not ill-appearing or toxic-appearing.  HENT:     Head: Normocephalic and atraumatic.     Nose: Nose normal.     Mouth/Throat:     Mouth: Mucous membranes are moist.     Pharynx: Oropharynx is clear.  Eyes:     General: No  scleral icterus.       Right eye: No discharge.        Left eye: No discharge.     Conjunctiva/sclera: Conjunctivae normal.  Cardiovascular:     Rate and Rhythm: Normal rate and  regular rhythm.     Pulses: Normal pulses.  Pulmonary:     Effort: Pulmonary effort is normal. No respiratory distress.  Musculoskeletal:     Cervical back: Neck supple.  Skin:    General: Skin is dry.     Findings: Rash present.     Comments: See images included in chart.  Of the right dorsal forearm there is a large area of erythema and induration with vesicles and central abrasion.  Nontender and no drainage.  There is a crusted area.  On the opposite arm there is erythematous vesicular rash.  Neurological:     General: No focal deficit present.     Mental Status: She is alert. Mental status is at baseline.     Motor: No weakness.     Gait: Gait normal.  Psychiatric:        Mood and Affect: Mood normal.        Behavior: Behavior normal.        Thought Content: Thought content normal.         UC Treatments / Results  Labs (all labs ordered are listed, but only abnormal results are displayed) Labs Reviewed - No data to display  EKG   Radiology No results found.  Procedures Procedures (including critical care time)  Medications Ordered in UC Medications - No data to display  Initial Impression / Assessment and Plan / UC Course  I have reviewed the triage vital signs and the nursing notes.  Pertinent labs & imaging results that were available during my care of the patient were reviewed by me and considered in my medical decision making (see chart for details).   52 year-old female presents for rash of bilateral arms for the past several days.  She recently got back from a cruise and noticed the rash while on a cruise.  Tried OTC meds without relief.  Someone else she went on the cruise has had a similar rash.   Images included in the chart appear to be consistent with contact dermatitis, but some suspicion for secondary cellulitis on the right arm.  Does not appear to be consistent with bedbugs or scabies.  Will treat at this time with triamcinolone cream, and Keflex  to cover infection.  Printed prescription for Medrol in case symptoms are not getting better in the next few days.  She reports she does not tolerate oral corticosteroids well and will try to hold off on using it.  Reviewed use cool compresses.  Reviewed return precautions.     Final Clinical Impressions(s) / UC Diagnoses   Final diagnoses:  Contact dermatitis, unspecified contact dermatitis type, unspecified trigger  Cellulitis of arm, right     Discharge Instructions      -Rash looks consistent with contact dermatitis but I have concerns about possible secondary infection of the area on the right arm. - Try the antibiotics and topical corticosteroid at this time as well as cool compresses.  If no improvement in the next few days or symptoms worsen then start the oral corticosteroids.     ED Prescriptions     Medication Sig Dispense Auth. Provider   cephALEXin (KEFLEX) 500 MG capsule Take 1 capsule (500 mg total) by mouth 4 (four) times daily  for 7 days. 28 capsule Eusebio Friendly B, PA-C   triamcinolone ointment (KENALOG) 0.5 % Apply 1 Application topically 2 (two) times daily. 30 g Eusebio Friendly B, PA-C   methylPREDNISolone (MEDROL DOSEPAK) 4 MG TBPK tablet Take according to dosepack 21 tablet Shirlee Latch, PA-C      PDMP not reviewed this encounter.   Shirlee Latch, PA-C 10/12/22 1036

## 2022-10-12 NOTE — Discharge Instructions (Addendum)
-  Rash looks consistent with contact dermatitis but I have concerns about possible secondary infection of the area on the right arm. - Try the antibiotics and topical corticosteroid at this time as well as cool compresses.  If no improvement in the next few days or symptoms worsen then start the oral corticosteroids.

## 2023-01-12 ENCOUNTER — Other Ambulatory Visit: Payer: Self-pay | Admitting: Family Medicine

## 2023-01-12 DIAGNOSIS — Z1231 Encounter for screening mammogram for malignant neoplasm of breast: Secondary | ICD-10-CM

## 2023-02-05 ENCOUNTER — Ambulatory Visit
Admission: RE | Admit: 2023-02-05 | Discharge: 2023-02-05 | Disposition: A | Discharge status: Home / Self Care | Payer: No Typology Code available for payment source | Source: Ambulatory Visit | Attending: Family Medicine | Admitting: Family Medicine

## 2023-02-05 DIAGNOSIS — Z1231 Encounter for screening mammogram for malignant neoplasm of breast: Secondary | ICD-10-CM | POA: Insufficient documentation

## 2023-03-02 IMAGING — MG MM DIGITAL DIAGNOSTIC UNILAT*R* W/ TOMO W/ CAD
6 series · 6 of 18 positions shown · non-contrast
Comparison: Previous exam(s).

CLINICAL DATA: Right breast possible mass seen on most recent
screening mammography.

EXAM:
DIGITAL DIAGNOSTIC UNILATERAL RIGHT MAMMOGRAM WITH TOMOSYNTHESIS AND
CAD; ULTRASOUND RIGHT BREAST LIMITED
TECHNIQUE: Right digital diagnostic mammography and breast tomosynthesis was
performed. The images were evaluated with computer-aided detection.;
Targeted ultrasound examination of the right breast was performed

[R CC synth-2D]
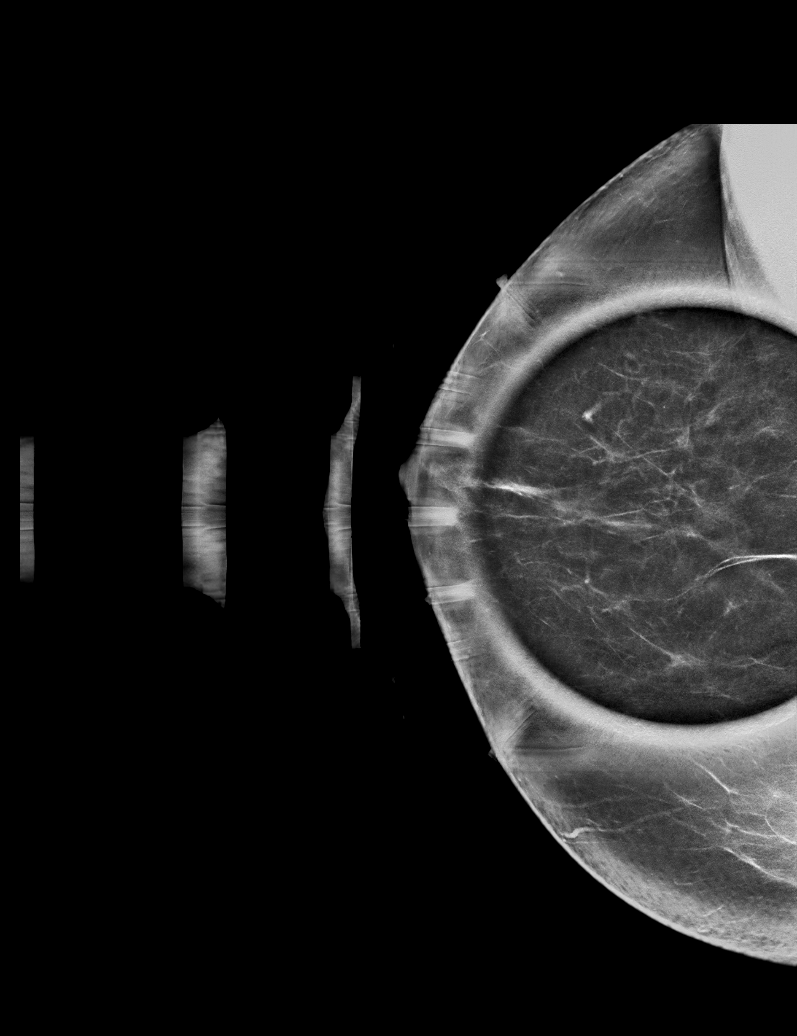

[R ML synth-2D]
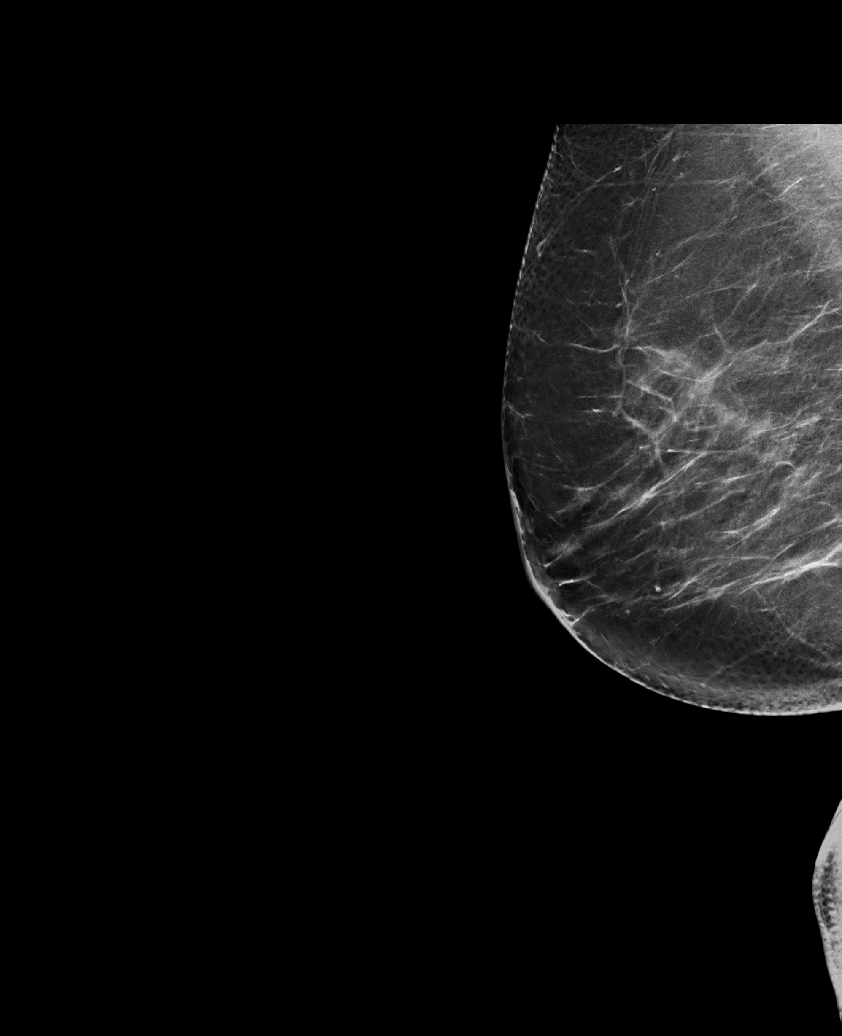

[R MLO synth-2D]
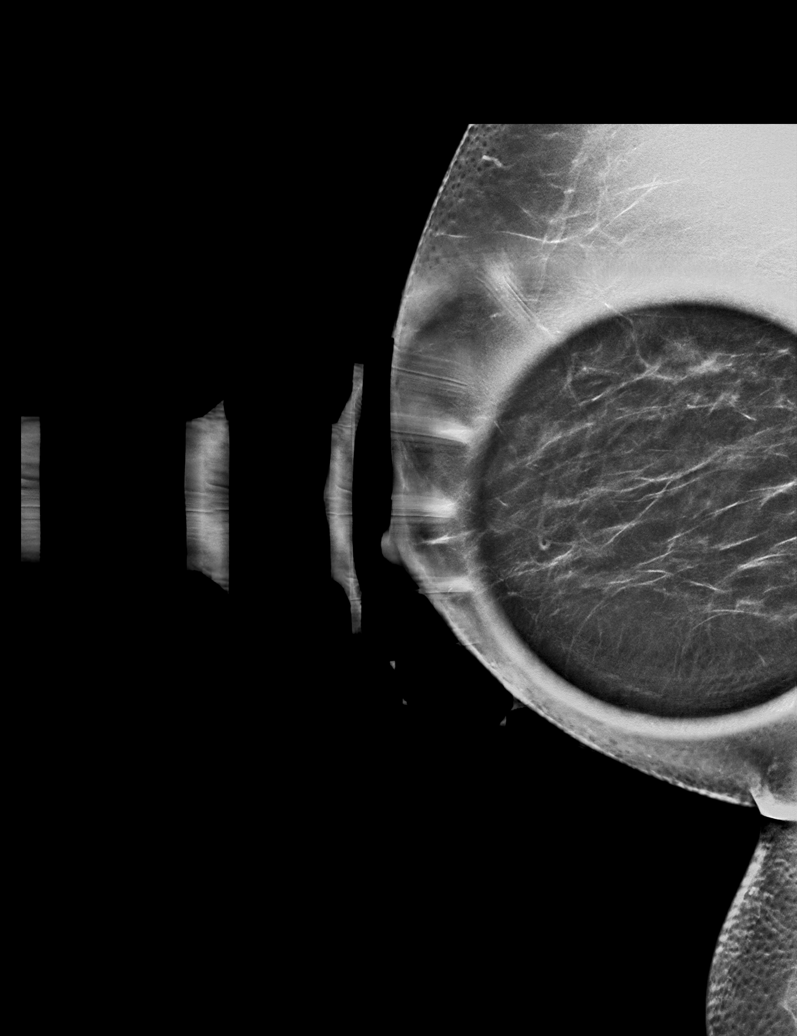

[R MLO tomo · tomo slice 35/69.0]
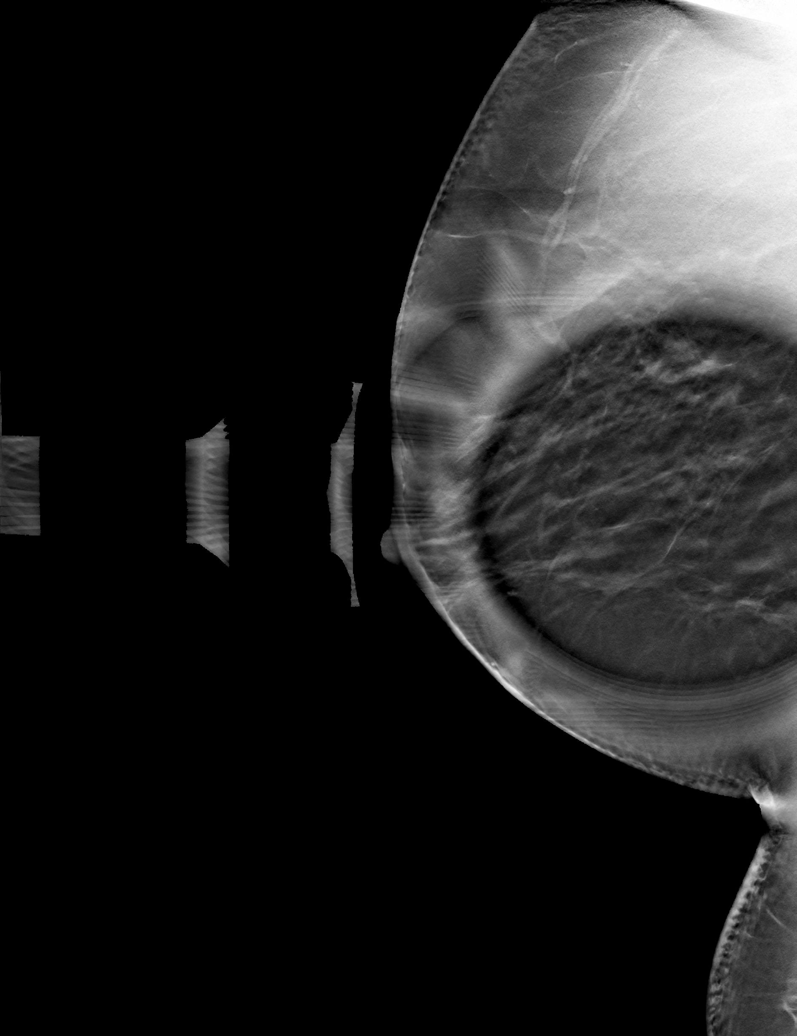

[R ML tomo · tomo slice 42/83.0]
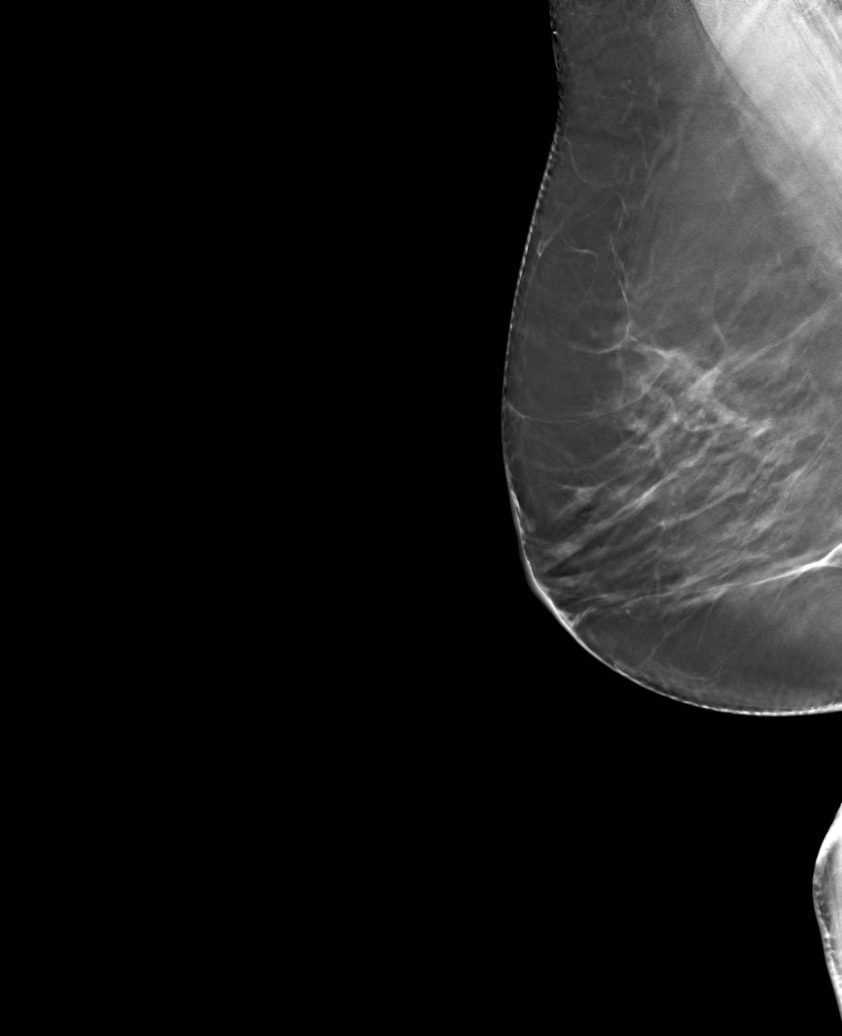

[R CC tomo · tomo slice 38/75.0]
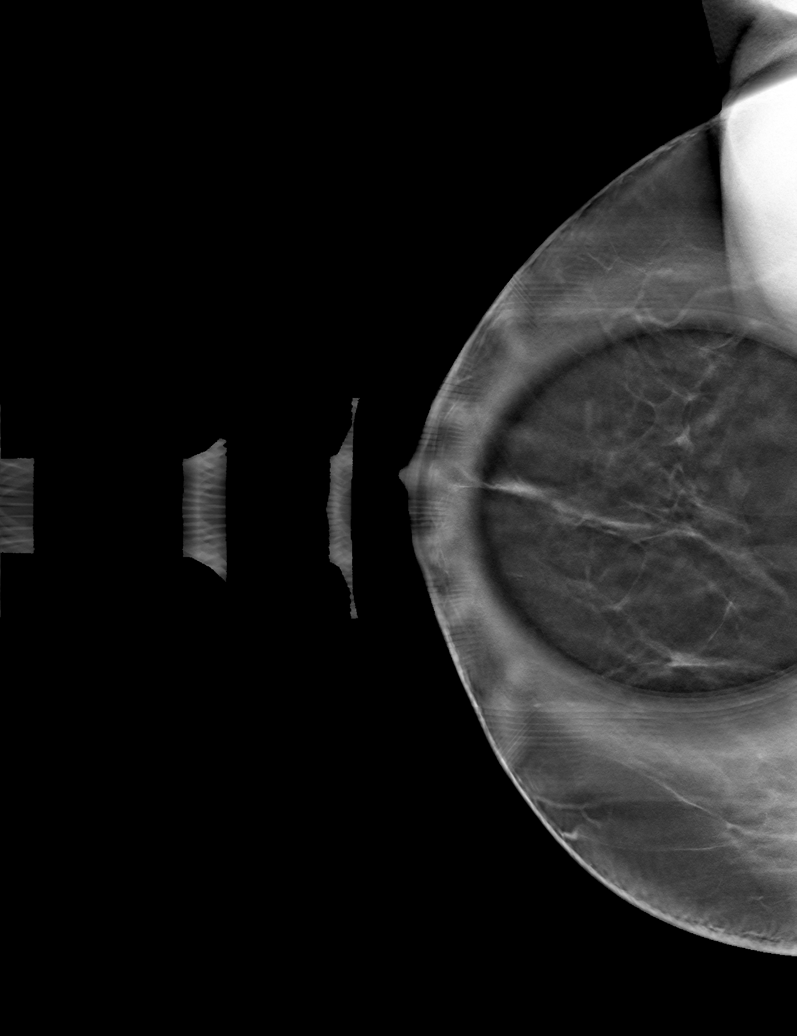

[6 of 18 positions shown; findings below may reference images not displayed]

ACR Breast Density Category b: There are scattered areas of
fibroglandular density.
FINDINGS: Additional mammographic views of the right breast demonstrate
persistent benign-appearing isodense to breast parenchyma
circumscribed 2-3 mm mass in the right 6 o'clock breast, middle
depth.

Targeted right breast ultrasound is performed demonstrating 6
o'clock 2 cm from nipple benign cyst measuring 0.4 x 0.3 x 0.4 cm.
This finding corresponds to the mammographically seen mass.
IMPRESSION: Right breast 6 o'clock benign cyst.

No mammographic or sonographic evidence of right breast malignancy.

RECOMMENDATION:
Screening mammogram in one year.(Code:9X-[id number])

I have discussed the findings and recommendations with the patient.
If applicable, a reminder letter will be sent to the patient
regarding the next appointment.

BI-RADS CATEGORY  2: Benign.

## 2023-03-02 IMAGING — US US BREAST*R* LIMITED INC AXILLA
1 series · 5 of 5 positions shown · non-contrast
Comparison: Previous exam(s).

CLINICAL DATA: Right breast possible mass seen on most recent
screening mammography.

EXAM:
DIGITAL DIAGNOSTIC UNILATERAL RIGHT MAMMOGRAM WITH TOMOSYNTHESIS AND
CAD; ULTRASOUND RIGHT BREAST LIMITED
TECHNIQUE: Right digital diagnostic mammography and breast tomosynthesis was
performed. The images were evaluated with computer-aided detection.;
Targeted ultrasound examination of the right breast was performed

[Series 1: us breast*right* limited inc axilla · 0.06mm/px · 5 of 5 slices shown]
[im 1/5]
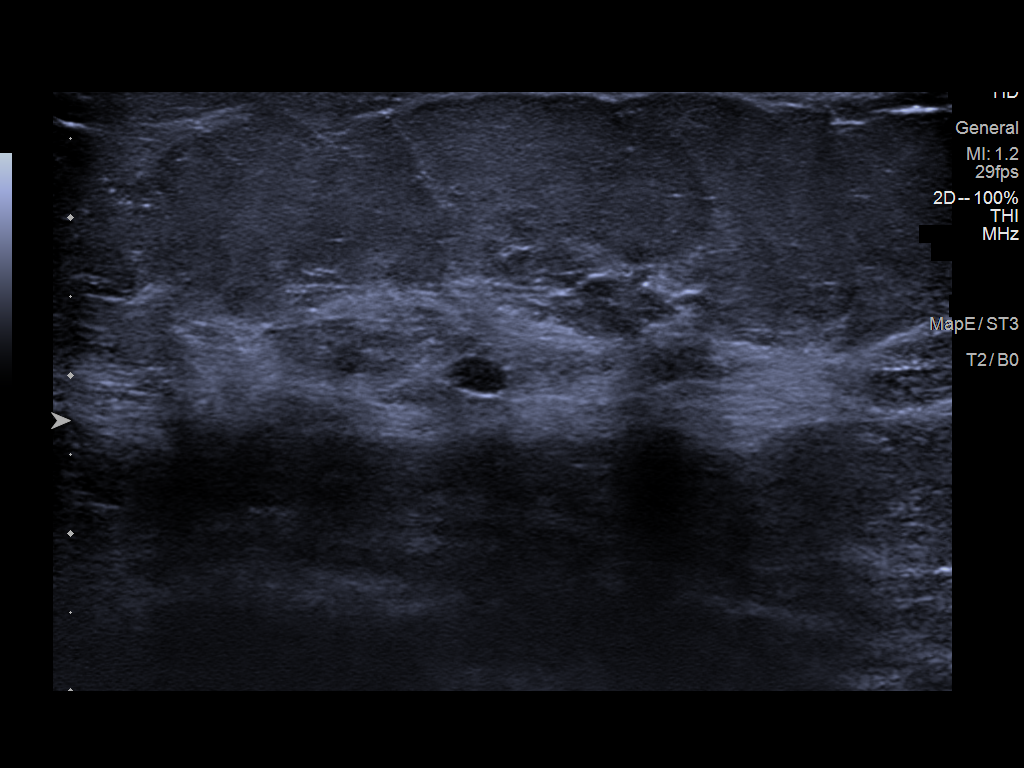
[im 2/5]
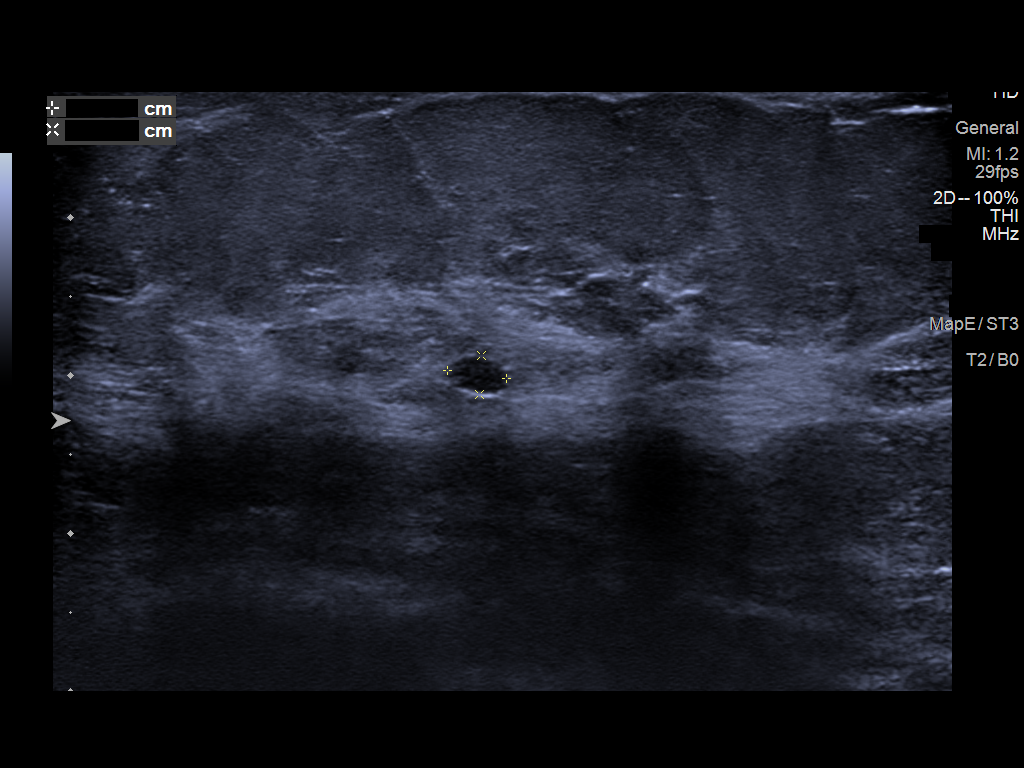
[im 3/5]
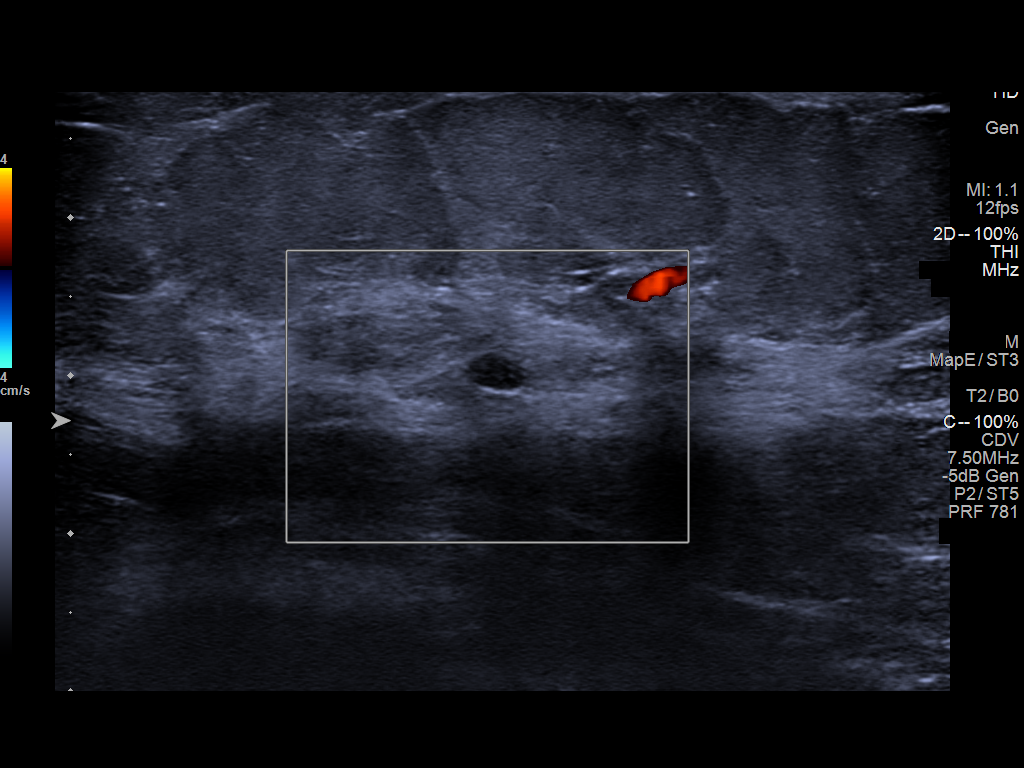
[im 4/5]
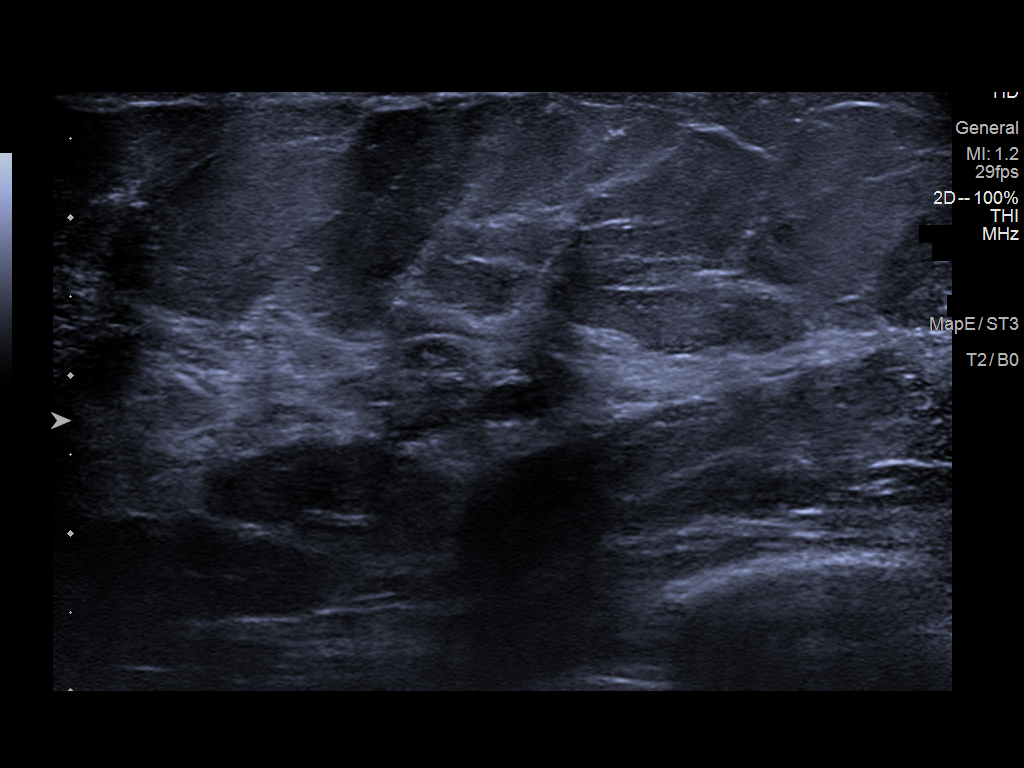
[im 5/5]
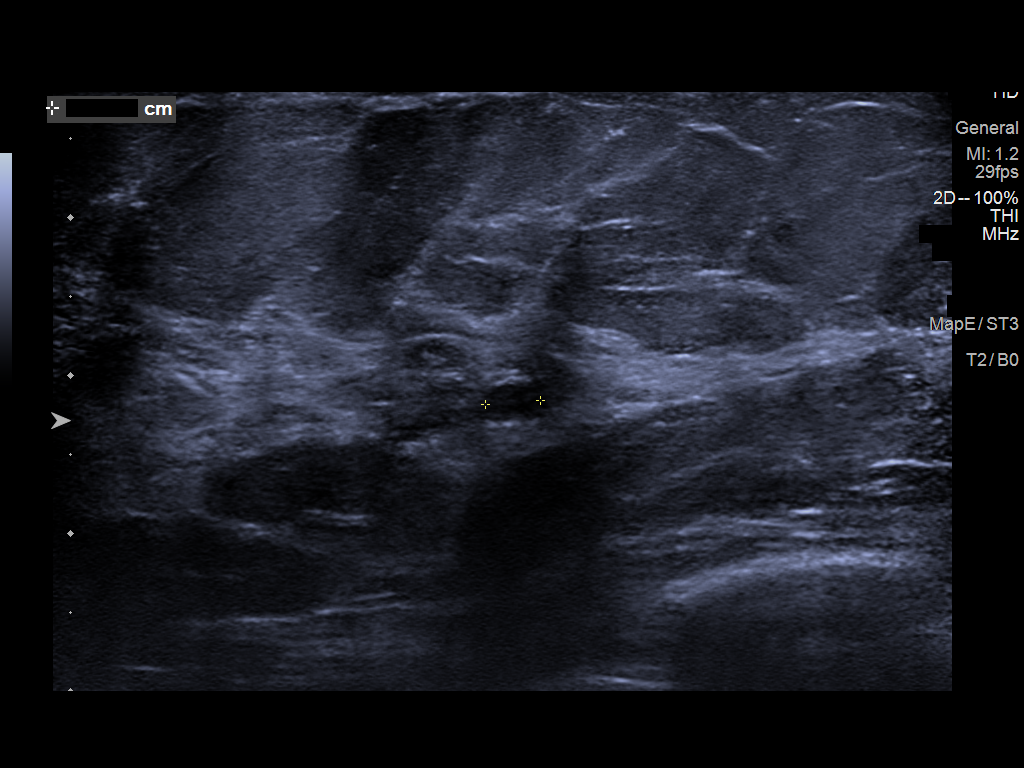

[5 of 5 positions shown; findings below may reference images not displayed]

ACR Breast Density Category b: There are scattered areas of
fibroglandular density.
FINDINGS: Additional mammographic views of the right breast demonstrate
persistent benign-appearing isodense to breast parenchyma
circumscribed 2-3 mm mass in the right 6 o'clock breast, middle
depth.

Targeted right breast ultrasound is performed demonstrating 6
o'clock 2 cm from nipple benign cyst measuring 0.4 x 0.3 x 0.4 cm.
This finding corresponds to the mammographically seen mass.
IMPRESSION: Right breast 6 o'clock benign cyst.

No mammographic or sonographic evidence of right breast malignancy.

RECOMMENDATION:
Screening mammogram in one year.(Code:[id number])

I have discussed the findings and recommendations with the patient.
If applicable, a reminder letter will be sent to the patient
regarding the next appointment.

BI-RADS CATEGORY  2: Benign.

## 2023-03-20 ENCOUNTER — Other Ambulatory Visit: Payer: Self-pay | Admitting: Family Medicine

## 2023-03-20 ENCOUNTER — Ambulatory Visit
Admission: RE | Admit: 2023-03-20 | Discharge: 2023-03-20 | Disposition: A | Discharge status: Home / Self Care | Payer: No Typology Code available for payment source | Source: Ambulatory Visit | Attending: Family Medicine | Admitting: Family Medicine

## 2023-03-20 ENCOUNTER — Ambulatory Visit
Admission: RE | Admit: 2023-03-20 | Discharge: 2023-03-20 | Disposition: A | Discharge status: Home / Self Care | Payer: No Typology Code available for payment source | Attending: Family Medicine | Admitting: Family Medicine

## 2023-03-20 DIAGNOSIS — G8929 Other chronic pain: Secondary | ICD-10-CM

## 2023-03-20 DIAGNOSIS — M25511 Pain in right shoulder: Secondary | ICD-10-CM | POA: Diagnosis present

## 2023-05-14 ENCOUNTER — Other Ambulatory Visit: Payer: Self-pay | Admitting: Orthopedic Surgery

## 2023-05-14 DIAGNOSIS — M5412 Radiculopathy, cervical region: Secondary | ICD-10-CM

## 2023-05-14 DIAGNOSIS — M25311 Other instability, right shoulder: Secondary | ICD-10-CM

## 2023-05-14 DIAGNOSIS — M4802 Spinal stenosis, cervical region: Secondary | ICD-10-CM

## 2023-06-01 ENCOUNTER — Encounter: Payer: Self-pay | Admitting: Orthopedic Surgery

## 2023-06-09 ENCOUNTER — Ambulatory Visit
Admission: RE | Admit: 2023-06-09 | Discharge: 2023-06-09 | Disposition: A | Discharge status: Home / Self Care | Payer: No Typology Code available for payment source | Source: Ambulatory Visit | Attending: Orthopedic Surgery | Admitting: Orthopedic Surgery

## 2023-06-09 DIAGNOSIS — M5412 Radiculopathy, cervical region: Secondary | ICD-10-CM

## 2023-06-09 DIAGNOSIS — M25311 Other instability, right shoulder: Secondary | ICD-10-CM

## 2023-06-09 DIAGNOSIS — M4802 Spinal stenosis, cervical region: Secondary | ICD-10-CM

## 2023-12-28 ENCOUNTER — Other Ambulatory Visit: Payer: Self-pay | Admitting: Family Medicine

## 2023-12-28 DIAGNOSIS — Z1231 Encounter for screening mammogram for malignant neoplasm of breast: Secondary | ICD-10-CM

## 2024-02-08 ENCOUNTER — Ambulatory Visit

## 2024-02-15 ENCOUNTER — Other Ambulatory Visit: Payer: Self-pay | Admitting: Family Medicine

## 2024-02-15 DIAGNOSIS — R1084 Generalized abdominal pain: Secondary | ICD-10-CM

## 2024-02-18 ENCOUNTER — Ambulatory Visit

## 2024-03-09 ENCOUNTER — Ambulatory Visit

## 2024-03-09 ENCOUNTER — Ambulatory Visit
Admission: RE | Admit: 2024-03-09 | Discharge: 2024-03-09 | Disposition: A | Discharge status: Home / Self Care | Source: Ambulatory Visit | Attending: Family Medicine | Admitting: Family Medicine

## 2024-03-09 DIAGNOSIS — Z1231 Encounter for screening mammogram for malignant neoplasm of breast: Secondary | ICD-10-CM | POA: Insufficient documentation
